# Patient Record
Sex: Female | Born: 1970 | Race: Black or African American | Hispanic: No | Marital: Married | Smoking: Never smoker
Health system: Southern US, Community
[De-identification: ages and names within clinical notes are randomized; demographics above are authoritative.]

## PROBLEM LIST (undated history)

## (undated) DIAGNOSIS — Z9889 Other specified postprocedural states: Secondary | ICD-10-CM

## (undated) DIAGNOSIS — Z789 Other specified health status: Secondary | ICD-10-CM

## (undated) DIAGNOSIS — R112 Nausea with vomiting, unspecified: Secondary | ICD-10-CM

---

## 1997-12-18 ENCOUNTER — Encounter: Admission: RE | Admit: 1997-12-18 | Discharge: 1997-12-18 | Payer: Self-pay | Admitting: Family Medicine

## 1998-01-15 ENCOUNTER — Encounter: Admission: RE | Admit: 1998-01-15 | Discharge: 1998-01-15 | Payer: Self-pay | Admitting: Sports Medicine

## 1998-02-11 ENCOUNTER — Ambulatory Visit (HOSPITAL_COMMUNITY): Admission: RE | Admit: 1998-02-11 | Discharge: 1998-02-11 | Payer: Self-pay | Admitting: *Deleted

## 1998-02-13 ENCOUNTER — Encounter: Admission: RE | Admit: 1998-02-13 | Discharge: 1998-02-13 | Payer: Self-pay | Admitting: Family Medicine

## 1998-03-14 ENCOUNTER — Encounter: Admission: RE | Admit: 1998-03-14 | Discharge: 1998-03-14 | Payer: Self-pay | Admitting: Family Medicine

## 1998-04-05 ENCOUNTER — Encounter: Admission: RE | Admit: 1998-04-05 | Discharge: 1998-04-05 | Payer: Self-pay | Admitting: Family Medicine

## 1998-04-26 ENCOUNTER — Encounter: Admission: RE | Admit: 1998-04-26 | Discharge: 1998-04-26 | Payer: Self-pay | Admitting: Family Medicine

## 1998-05-07 ENCOUNTER — Encounter: Admission: RE | Admit: 1998-05-07 | Discharge: 1998-05-07 | Payer: Self-pay | Admitting: Family Medicine

## 1998-05-14 ENCOUNTER — Encounter: Admission: RE | Admit: 1998-05-14 | Discharge: 1998-05-14 | Payer: Self-pay | Admitting: Sports Medicine

## 1998-05-28 ENCOUNTER — Encounter: Admission: RE | Admit: 1998-05-28 | Discharge: 1998-05-28 | Payer: Self-pay | Admitting: Family Medicine

## 1998-06-06 ENCOUNTER — Inpatient Hospital Stay (HOSPITAL_COMMUNITY): Admission: AD | Admit: 1998-06-06 | Discharge: 1998-06-09 | Payer: Self-pay | Admitting: *Deleted

## 1998-06-06 ENCOUNTER — Encounter: Admission: RE | Admit: 1998-06-06 | Discharge: 1998-06-06 | Payer: Self-pay | Admitting: Family Medicine

## 1998-06-12 ENCOUNTER — Encounter: Admission: RE | Admit: 1998-06-12 | Discharge: 1998-06-12 | Payer: Self-pay | Admitting: Family Medicine

## 2000-06-29 ENCOUNTER — Other Ambulatory Visit: Admission: RE | Admit: 2000-06-29 | Discharge: 2000-06-29 | Payer: Self-pay | Admitting: Obstetrics and Gynecology

## 2000-07-16 ENCOUNTER — Encounter: Payer: Self-pay | Admitting: Obstetrics and Gynecology

## 2000-07-16 ENCOUNTER — Inpatient Hospital Stay (HOSPITAL_COMMUNITY): Admission: AD | Admit: 2000-07-16 | Discharge: 2000-07-16 | Payer: Self-pay | Admitting: Obstetrics and Gynecology

## 2001-02-16 ENCOUNTER — Inpatient Hospital Stay (HOSPITAL_COMMUNITY): Admission: AD | Admit: 2001-02-16 | Discharge: 2001-02-19 | Payer: Self-pay | Admitting: Obstetrics & Gynecology

## 2001-02-16 ENCOUNTER — Encounter (INDEPENDENT_AMBULATORY_CARE_PROVIDER_SITE_OTHER): Payer: Self-pay | Admitting: Specialist

## 2003-03-26 ENCOUNTER — Other Ambulatory Visit: Admission: RE | Admit: 2003-03-26 | Discharge: 2003-03-26 | Payer: Self-pay | Admitting: *Deleted

## 2003-06-11 ENCOUNTER — Ambulatory Visit (HOSPITAL_COMMUNITY): Admission: RE | Admit: 2003-06-11 | Discharge: 2003-06-11 | Payer: Self-pay | Admitting: Obstetrics & Gynecology

## 2003-06-11 ENCOUNTER — Encounter: Payer: Self-pay | Admitting: Obstetrics & Gynecology

## 2003-06-18 ENCOUNTER — Inpatient Hospital Stay (HOSPITAL_COMMUNITY): Admission: AD | Admit: 2003-06-18 | Discharge: 2003-06-18 | Payer: Self-pay | Admitting: Obstetrics

## 2003-08-04 ENCOUNTER — Inpatient Hospital Stay (HOSPITAL_COMMUNITY): Admission: AD | Admit: 2003-08-04 | Discharge: 2003-08-04 | Payer: Self-pay

## 2003-10-22 ENCOUNTER — Encounter (INDEPENDENT_AMBULATORY_CARE_PROVIDER_SITE_OTHER): Payer: Self-pay | Admitting: Specialist

## 2003-10-22 ENCOUNTER — Inpatient Hospital Stay (HOSPITAL_COMMUNITY): Admission: RE | Admit: 2003-10-22 | Discharge: 2003-10-25 | Payer: Self-pay | Admitting: Obstetrics & Gynecology

## 2003-11-27 ENCOUNTER — Encounter: Admission: RE | Admit: 2003-11-27 | Discharge: 2003-12-27 | Payer: Self-pay | Admitting: Obstetrics & Gynecology

## 2005-01-08 ENCOUNTER — Emergency Department (HOSPITAL_COMMUNITY): Admission: EM | Admit: 2005-01-08 | Discharge: 2005-01-08 | Payer: Self-pay | Admitting: Emergency Medicine

## 2006-03-09 ENCOUNTER — Encounter: Admission: RE | Admit: 2006-03-09 | Discharge: 2006-03-09 | Payer: Self-pay | Admitting: General Practice

## 2006-03-15 ENCOUNTER — Encounter: Admission: RE | Admit: 2006-03-15 | Discharge: 2006-04-02 | Payer: Self-pay | Admitting: General Practice

## 2007-08-20 ENCOUNTER — Emergency Department (HOSPITAL_COMMUNITY): Admission: EM | Admit: 2007-08-20 | Discharge: 2007-08-20 | Payer: Self-pay | Admitting: Emergency Medicine

## 2009-08-26 ENCOUNTER — Encounter: Admission: RE | Admit: 2009-08-26 | Discharge: 2009-08-26 | Payer: Self-pay | Admitting: Family Medicine

## 2011-01-23 NOTE — Op Note (Signed)
Kerri Sims, Kerri Sims                           ACCOUNT NO.:  [id number]   MEDICAL RECORD NO.:  [id number]                   PATIENT TYPE:  INP   LOCATION:  9128                                 FACILITY:  [facility]   PHYSICIAN:  Roseanna Rainbow, M.D.         DATE OF BIRTH:  02-17-1971   DATE OF PROCEDURE:  10/22/2003  DATE OF DISCHARGE:                                 OPERATIVE REPORT   PREOPERATIVE DIAGNOSES:  1. Intrauterine pregnancy at term.  2. History of two previous cesarean deliveries.  3. Desires sterilization procedure.   POSTOPERATIVE DIAGNOSES:  1. Intrauterine pregnancy at term.  2. History of two previous cesarean deliveries.  3. Desires sterilization procedure.   PROCEDURE:  Repeat cesarean section and modified Pomeroy bilateral tubal  ligation.   SURGEON:  Roseanna Rainbow, M.D.   ASSISTANT:  Bing Neighbors. Clearance Coots, M.D.   ANESTHESIA:  Spinal.   ESTIMATED BLOOD LOSS:  600 mL.   COMPLICATIONS:  None.   FINDINGS:  Live born female, vertex, 9 pounds 8 ounces.  There were several  subserosal myomas.   DESCRIPTION OF PROCEDURE:  The patient is taken to the operating room where  spinal anesthetic was administered without difficulty.  She was then placed  in the dorsal supine position with a leftward tilt, prepped and draped in  the usual sterile fashion.  A Pfannenstiel skin incision was then made with  the scalpel and carried down to the underlying layer of fascia with the  Bovie.  Fascia was nicked in the midline and this incision was extended  bilaterally with curved Mayo scissors.  The superior aspect of the fascial  incision was then grasped with Kocher clamps, tented up and the underlying  rectus muscles dissected off.  The inferior aspect of the fascial incision  was manipulated in similar fashion.  The rectus muscles were separated in  the midline.  The parietal peritoneum was tented up and entered sharply.  The peritoneal incision was then extended  superiorly and inferiorly with  good visualization of the bladder.  The bladder blade was then placed.  The  vesicouterine peritoneum was then tented up and entered sharply with  Metzenbaum scissors and the bladder flap was created sharply.  The bladder  blade was then replaced.  The lower uterine segment was then incised in a  transverse fashion with the scalpel.  The uterine incision was then extended  bluntly.  The infant's head was then delivered atraumatically.  The  oropharynx was suctioned with a bulb suction.  The cord was clamped and cut.  The infant was handed off to the awaiting neonatologist.  The placenta was  then delivered.  The uterus evacuated of amniotic fluid.  Clots and debris  were then wiped with laparotomy sponge.  The uterus was exteriorized.  The  uterine incision was then repaired with 0 Monocryl in a running interlocking  fashion.  Adequate hemostasis was noted.  The  left fallopian tube was  grasped in the mid isthmic area.  Two ligatures of 0 plain were then placed  and the intervening of tube excised.  Excellent hemostasis was noted.  The  right fallopian tube was manipulated in a similar fashion. The uterus was  returned to the abdomen.  Pericolic gutters were then irrigated.  The  parietal peritoneum was reapproximated with 2-0 Vicryl.  The fascia was  reapproximated with 0 PDS.  The skin was reapproximated with staples.  At  the close of the procedure the instrument and pad counts were said to be  correct x2.  The patient was taken to the PACU awake and in stable  condition.                                               Roseanna Rainbow, M.D.    Judee Clara  D:  10/22/2003  T:  10/22/2003  Job:  [id number]

## 2011-01-23 NOTE — Op Note (Signed)
[facility]  Patient:    Kerri Sims, Kerri Sims                        MRN: [id number] Proc. Date: 02/16/01 Adm. Date:  [id number] Attending:  Lars Pinks                           Operative Report  PREOPERATIVE DIAGNOSES:       1. Failed vaginal birth after cesarean.                               2. Failure to progress.  POSTOPERATIVE DIAGNOSES:      1. Failed vaginal birth after cesarean.                               2. Failure to progress.  PROCEDURE:                    Repeat low transverse cesarean section.  SURGEON:                      Richard D. Arlyce Dice, M.D.  ANESTHESIA:                   Epidural.  ESTIMATED BLOOD LOSS:         500 cc.  FINDINGS:                     Female infant.  Birth weight 8 lb 4 oz.  Apgars 9 and 9.  Clear amniotic fluid.  Normal-appearing tubes and ovaries.  Small fibroid on the fundus of the uterus.  INDICATIONS:                  This is a 40 year old gravida 2, para 1 who requested a trial of labor after cesarean.  The patient made slow progress and the decision was made after the patient was falling off the labor curve to offer the patient a cesarean section.  The patient considered the option of continuing the labor and requested that a repeat cesarean section be performed.  DESCRIPTION OF PROCEDURE:     The patient was taken to the operating room and an epidural anesthesia that had been used during labor was injected for surgical anesthesia.  The abdomen was prepped and draped in a sterile fashion. A low transverse incision white female through the previous laparoscopy scar and carried down to the level of the fascia, which was then extended transversely. The rectus muscle was then divided in the midline and the peritoneal cavity was entered sharply and extended bluntly.  The lower segment was identified. A bladder flap was created and the bladder was displaced inferiorly.  The lower segment was entered  sharply and extended bluntly.  The infant was then delivered.  Cord bloods were obtained.  The placenta was delivered.  The uterus was bluntly curetted.  The lower segment was closed with a single layer of running interlocking #1 Vicryl suture.  Hemostasis was noted to be present. The bladder flap was not reopposed.  The peritoneum was closed with a running 0 Vicryl suture. The fascia was closed with running 0 Vicryl suture.  The skin was closed with staples.  The patient tolerated the procedure well and left the operating room  in good condition. DD:  02/16/01 TD:  02/17/01 Job: [id number]

## 2011-01-23 NOTE — Discharge Summary (Signed)
Kerri Sims, Kerri Sims                           ACCOUNT NO.:  [id number]   MEDICAL RECORD NO.:  [id number]                   PATIENT TYPE:  INP   LOCATION:  9128                                 FACILITY:  [facility]   PHYSICIAN:  Charles A. Clearance Coots, M.D.             DATE OF BIRTH:  1970-12-03   DATE OF ADMISSION:  10/22/2003  DATE OF DISCHARGE:  10/25/2003                                 DISCHARGE SUMMARY   ADMITTING DIAGNOSES:  1. Thirty-nine week gestation.  2. Previous cesarean section x2.  3. Desires repeat cesarean section.  4. Desires permanent sterilization.   DISCHARGE DIAGNOSES:  1. Thirty-nine week gestation.  2. Previous cesarean section x2.  3. Status post low transverse cesarean section delivery of a viable female on     October 22, 2003 at 0907.  Apgars were 9 at 1 minute and 9 at 5 minutes.     Weight was 4325 gm.  Length was 53 cm.  Mother and infant discharged home     in good condition.  4. Bilateral tubal ligation.   REASON FOR ADMISSION:  A 40 year old, para 2, female, estimated date of  confinement of October 26, 2003, intrauterine pregnancy at [redacted] weeks  gestation, who presented for elective repeat cesarean section delivery and  bilateral tubal ligation.  The patient has a history of 2 previous cesarean  sections.   PAST MEDICAL HISTORY:   SURGERY:  Two cesarean sections.   ILLNESSES:  None.   MEDICATIONS:  Prenatal vitamins.   ALLERGIES:  No known drug allergies.   SOCIAL HISTORY:  Married.  Negative tobacco, alcohol, or recreational drug  use.   PHYSICAL EXAMINATION:  GENERAL:  Well-nourished, well-developed female in no  acute distress.  HEENT:  Normal.  NECK:  Supple without adenopathy.  LUNGS:  Clear to auscultation bilaterally.  HEART:  Regular rate and rhythm.  ABDOMEN:  Gravid, soft, nontender.  PELVIC:  Omitted.   ADMITTING LABORATORY VALUES:  Hemoglobin 12.7, hematocrit 36.9, white blood  cell count 8000, platelets 305,000.   HOSPITAL  COURSE:  The patient underwent a repeat low transverse cesarean  section and bilateral tubal ligation on October 22, 2003.  There were no  intraoperative complications.  The postoperative course was uncomplicated,  and the patient was discharged home on postoperative day #3 in good  condition.   DISCHARGE LABORATORY VALUES:  Hemoglobin 11.5, hematocrit 33.8, white blood  cell count 10,000, platelets 294,000.   DISCHARGE DISPOSITION:  1. Continue prenatal vitamins, Tylox, and ibuprofen prescribed for pain.  2. Routine written obstetrical discharge instructions were given for     cesarean section.  3. The patient is to call an office for a follow up appointment in 6 weeks.  Charles A. Clearance Coots, M.D.    CAH/MEDQ  D:  10/25/2003  T:  10/26/2003  Job:  [id number]

## 2011-01-23 NOTE — Discharge Summary (Signed)
[facility]  Patient:    Kerri Sims, Kerri Sims                        MRN: [id number] Adm. Date:  [id number] Disc. Date: [id number] Attending:  Lars Pinks Dictator:   Leilani Able, P.A.                           Discharge Summary  FINAL DIAGNOSES:              Failed vaginal birth after cesarean section and failure to progress.  PROCEDURE:                    Repeat low transverse cesarean section. Surgeon:  Dr. Ilda Mori.  Complications:  None.  HISTORY:                      This 40 year old G2, P1 requested a trial of labor after history of cesarean section.  Patient made slow progress and the decision was made to proceed with a repeat cesarean section after patient falling off a labor curve.  Patient considered options and decided to repeat her cesarean section.  Patient was taken to the operating room by Dr. Ilda Mori where a repeat low transverse cesarean section was performed with the delivery of an 8 pound 4 ounce female infant with Apgars of 9 and 9.  Delivery went without complications.  There as a small fibroid noted on the fundus of the uterus.  Patients postoperative course was benign without significant fevers.  Patient was felt ready for discharge on postoperative day #3.  She was sent home on a regular diet.  Told to decrease activities.  Told to continue prenatal vitamins and FeSo4.  Was given Motrin 600 mg one q.6h. p.r.n. pain.  Told to follow-up in the office in four weeks. DD:  03/09/01 TD:  03/09/01 Job: [id number]

## 2011-09-04 ENCOUNTER — Other Ambulatory Visit: Payer: Self-pay | Admitting: Obstetrics & Gynecology

## 2011-09-04 DIAGNOSIS — N9489 Other specified conditions associated with female genital organs and menstrual cycle: Secondary | ICD-10-CM

## 2011-09-11 ENCOUNTER — Ambulatory Visit (HOSPITAL_COMMUNITY)
Admission: RE | Admit: 2011-09-11 | Discharge: 2011-09-11 | Disposition: A | Discharge status: Home / Self Care | Payer: Self-pay | Source: Ambulatory Visit | Attending: Obstetrics & Gynecology | Admitting: Obstetrics & Gynecology

## 2011-09-11 DIAGNOSIS — N9489 Other specified conditions associated with female genital organs and menstrual cycle: Secondary | ICD-10-CM | POA: Insufficient documentation

## 2011-09-11 DIAGNOSIS — D252 Subserosal leiomyoma of uterus: Secondary | ICD-10-CM | POA: Insufficient documentation

## 2012-08-28 ENCOUNTER — Ambulatory Visit: Payer: Self-pay | Admitting: Physician Assistant

## 2012-08-28 VITALS — BP 107/74 | HR 110 | Temp 100.7°F | Resp 17 | Ht 63.0 in | Wt 184.0 lb

## 2012-08-28 DIAGNOSIS — R509 Fever, unspecified: Secondary | ICD-10-CM

## 2012-08-28 DIAGNOSIS — J029 Acute pharyngitis, unspecified: Secondary | ICD-10-CM

## 2012-08-28 DIAGNOSIS — J101 Influenza due to other identified influenza virus with other respiratory manifestations: Secondary | ICD-10-CM

## 2012-08-28 DIAGNOSIS — J111 Influenza due to unidentified influenza virus with other respiratory manifestations: Secondary | ICD-10-CM

## 2012-08-28 LAB — POCT INFLUENZA A/B: Influenza B, POC: NEGATIVE

## 2012-08-28 MED ORDER — OSELTAMIVIR PHOSPHATE 75 MG PO CAPS: 75.0000 mg | ORAL_CAPSULE | Freq: Two times a day (BID) | ORAL | Status: DC

## 2012-08-28 MED ORDER — ACETAMINOPHEN 325 MG PO TABS
1000.0000 mg | ORAL_TABLET | Freq: Once | ORAL | Status: AC
  Administered 2012-08-28: 975 mg via ORAL

## 2012-08-28 NOTE — Progress Notes (Signed)
  Subjective:    Patient ID: Kerri Sims, female    DOB: April 08, 1971, 41 y.o.   MRN: [id number]  HPI  Acute onset of fever, bodyaches, cough, runny nose, sneezing, and sore throat. She started feeling bad <48 hrs ago. No N/V/D.  She hasn't taken any OTCs.  Review of Systems  All other systems reviewed and are negative.       Objective:   Physical Exam  Nursing note and vitals reviewed. Constitutional: She is oriented to person, place, and time. She appears well-developed and well-nourished.       Appears ill, but not toxic  HENT:  Head: Normocephalic and atraumatic.  Right Ear: External ear normal.  Left Ear: External ear normal.  Mouth/Throat: No oropharyngeal exudate (throat with erythema and PND, no exudate).       Nose with clear rhinorrhea  Neck: Normal range of motion. Neck supple.  Cardiovascular: Normal rate, regular rhythm and normal heart sounds.        Mild tachycardia secondary to fever.  Pulmonary/Chest: Effort normal and breath sounds normal. She has no wheezes. She has no rales.  Lymphadenopathy:    She has no cervical adenopathy.  Neurological: She is alert and oriented to person, place, and time.  Skin: Skin is warm and dry.  Psychiatric: She has a normal mood and affect. Her behavior is normal.    Results for orders placed in visit on 08/28/12  POCT INFLUENZA A/B      Component Value Range   Influenza A, POC Positive     Influenza B, POC Negative           Assessment & Plan:  Influenza A-tamiflu, fluids, rest, respiratory care, OTC meds as needed to help with symptoms.

## 2012-12-02 ENCOUNTER — Encounter (HOSPITAL_COMMUNITY): Payer: Self-pay | Admitting: Emergency Medicine

## 2012-12-02 ENCOUNTER — Emergency Department (HOSPITAL_COMMUNITY): Payer: Self-pay

## 2012-12-02 ENCOUNTER — Emergency Department (HOSPITAL_COMMUNITY)
Admission: EM | Admit: 2012-12-02 | Discharge: 2012-12-02 | Disposition: A | Discharge status: Home / Self Care | Payer: Self-pay | Attending: Emergency Medicine | Admitting: Emergency Medicine

## 2012-12-02 DIAGNOSIS — E669 Obesity, unspecified: Secondary | ICD-10-CM | POA: Insufficient documentation

## 2012-12-02 DIAGNOSIS — Z862 Personal history of diseases of the blood and blood-forming organs and certain disorders involving the immune mechanism: Secondary | ICD-10-CM | POA: Insufficient documentation

## 2012-12-02 DIAGNOSIS — Z3202 Encounter for pregnancy test, result negative: Secondary | ICD-10-CM | POA: Insufficient documentation

## 2012-12-02 DIAGNOSIS — R1031 Right lower quadrant pain: Secondary | ICD-10-CM | POA: Insufficient documentation

## 2012-12-02 DIAGNOSIS — R109 Unspecified abdominal pain: Secondary | ICD-10-CM

## 2012-12-02 LAB — COMPREHENSIVE METABOLIC PANEL
ALT: 9 U/L (ref 0–35)
AST: 14 U/L (ref 0–37)
Albumin: 3.7 g/dL (ref 3.5–5.2)
Alkaline Phosphatase: 47 U/L (ref 39–117)
CO2: 23 mEq/L (ref 19–32)
Chloride: 101 mEq/L (ref 96–112)
Creatinine, Ser: 0.67 mg/dL (ref 0.50–1.10)
GFR calc non Af Amer: >90 mL/min (ref >90)
Potassium: 3.5 mEq/L (ref 3.5–5.1)
Sodium: 135 mEq/L (ref 135–145)
Total Bilirubin: 0.2 mg/dL — ABNORMAL LOW (ref 0.3–1.2)

## 2012-12-02 LAB — URINALYSIS, ROUTINE W REFLEX MICROSCOPIC
Glucose, UA: NEGATIVE mg/dL
Ketones, ur: NEGATIVE mg/dL
Nitrite: POSITIVE — AB
Protein, ur: NEGATIVE mg/dL

## 2012-12-02 LAB — CBC WITH DIFFERENTIAL/PLATELET
Basophils Absolute: 0.1 10*3/uL (ref 0.0–0.1)
Basophils Relative: 1 % (ref 0–1)
HCT: 27.6 % — ABNORMAL LOW (ref 36.0–46.0)
Lymphocytes Relative: 39 % (ref 12–46)
MCHC: 31.9 g/dL (ref 30.0–36.0)
Neutro Abs: 3.4 10*3/uL (ref 1.7–7.7)
Neutrophils Relative %: 51 % (ref 43–77)
Platelets: 399 10*3/uL (ref 150–400)
RDW: 15.6 % — ABNORMAL HIGH (ref 11.5–15.5)
WBC: 6.7 10*3/uL (ref 4.0–10.5)

## 2012-12-02 LAB — WET PREP, GENITAL
Trich, Wet Prep: NONE SEEN
Yeast Wet Prep HPF POC: NONE SEEN

## 2012-12-02 LAB — URINE MICROSCOPIC-ADD ON

## 2012-12-02 MED ORDER — IOHEXOL 300 MG/ML  SOLN
50.0000 mL | Freq: Once | INTRAMUSCULAR | Status: AC | PRN
  Administered 2012-12-02: 50 mL via ORAL

## 2012-12-02 MED ORDER — IOHEXOL 300 MG/ML  SOLN
100.0000 mL | Freq: Once | INTRAMUSCULAR | Status: AC | PRN
  Administered 2012-12-02: 100 mL via INTRAVENOUS

## 2012-12-02 MED ORDER — HYDROCODONE-ACETAMINOPHEN 5-325 MG PO TABS: 1.0000 | ORAL_TABLET | ORAL | Status: DC | PRN

## 2012-12-02 MED ORDER — MORPHINE SULFATE 4 MG/ML IJ SOLN
4.0000 mg | Freq: Once | INTRAMUSCULAR | Status: AC
  Administered 2012-12-02: 4 mg via INTRAVENOUS
  Filled 2012-12-02: qty 1

## 2012-12-02 NOTE — ED Notes (Signed)
Ct notified that pt finished drinking with first cup the contrast.

## 2012-12-02 NOTE — ED Notes (Signed)
Pt c/o pain in lower abd st's mostly in RLQ radiating into back.  Pt denies any nausea vomiting or diarrhea.  Onset yesterday.  Denies any urinary symptoms

## 2012-12-02 NOTE — ED Provider Notes (Signed)
History     CSN: [id number]  Arrival date & time 12/02/12  1543   None     Chief Complaint  Patient presents with  . Abdominal Pain    (Consider location/radiation/quality/duration/timing/severity/associated sxs/prior treatment) HPI History provided by pt.   Pt has had constant, severe RLQ pain since yesterday.  Aggravated by urination.  No associated fever, N/V/D, hematochezia/melena, vaginal sx, or other urinary sx, w/ exception that it is green in color.  H/o iron-deficiency anemia, likely secondary to menorrhagia (LMP 2 weeks ago) and uterine fibroids, otherwise healthy.  Past abd surgeries include 3 c-sections.     History reviewed. No pertinent past medical history.  Past Surgical History  Procedure Laterality Date  . Cesarean section      No family history on file.  History  Substance Use Topics  . Smoking status: Never Smoker   . Smokeless tobacco: Not on file  . Alcohol Use: No    OB History   Grav Para Term Preterm Abortions TAB SAB Ect Mult Living                  Review of Systems  Constitutional: Negative for fatigue.       No generalized weakness  Respiratory: Negative for shortness of breath.   Neurological: Negative for syncope and light-headedness.  All other systems reviewed and are negative.    Allergies  Review of patient's allergies indicates no known allergies.  Home Medications   Current Outpatient Rx  Name  Route  Sig  Dispense  Refill  . ibuprofen (ADVIL,MOTRIN) 200 MG tablet   Oral   Take 400 mg by mouth daily as needed for pain. For pain         . metroNIDAZOLE (FLAGYL) 250 MG tablet   Oral   Take 500 mg by mouth daily.           BP 116/69  Pulse 87  Temp(Src) 98.8 F (37.1 C) (Oral)  Resp 16  Ht 5\' 2"  (1.575 m)  Wt 176 lb (79.833 kg)  BMI 32.18 kg/m2  SpO2 100%  LMP 11/18/2012  Physical Exam  Nursing note and vitals reviewed. Constitutional: She is oriented to person, place, and time. She appears  well-developed and well-nourished. No distress.  HENT:  Head: Normocephalic and atraumatic.  Eyes:  Normal appearance  Neck: Normal range of motion.  Cardiovascular: Normal rate and regular rhythm.   Pulmonary/Chest: Effort normal and breath sounds normal. No respiratory distress.  Abdominal: Soft. Bowel sounds are normal. She exhibits no distension and no mass. There is no rebound.  Obese.  Diffuse tenderness across lower abdomen, worst in RLQ w/ guarding.    Genitourinary:  No CVA tenderness. Nml external genitalia.  No vaginal discharge/bleeding.  Cervix closed and appears nml.  No adnexal or cervical motion tenderess.    Musculoskeletal: Normal range of motion.  Neurological: She is alert and oriented to person, place, and time.  Skin: Skin is warm and dry. No rash noted.  Psychiatric: She has a normal mood and affect. Her behavior is normal.    ED Course  Procedures (including critical care time)  Labs Reviewed  URINALYSIS, ROUTINE W REFLEX MICROSCOPIC - Abnormal; Notable for the following:    Nitrite POSITIVE (*)    Leukocytes, UA MODERATE (*)    All other components within normal limits  CBC WITH DIFFERENTIAL - Abnormal; Notable for the following:    RBC 3.57 (*)    Hemoglobin 8.8 (*)  HCT 27.6 (*)    MCV 77.3 (*)    MCH 24.6 (*)    RDW 15.6 (*)    All other components within normal limits  COMPREHENSIVE METABOLIC PANEL - Abnormal; Notable for the following:    Glucose, Bld 115 (*)    Total Bilirubin 0.2 (*)    All other components within normal limits  URINE MICROSCOPIC-ADD ON - Abnormal; Notable for the following:    Squamous Epithelial / LPF FEW (*)    All other components within normal limits  WET PREP, GENITAL  GC/CHLAMYDIA PROBE AMP  POCT PREGNANCY, URINE   Ct Abdomen Pelvis W Contrast  12/02/2012  *RADIOLOGY REPORT*  Clinical Data: Right lower quadrant pain for 2 days question appendicitis  CT ABDOMEN AND PELVIS WITH CONTRAST  Technique:  Multidetector CT  imaging of the abdomen and pelvis was performed following the standard protocol during bolus administration of intravenous contrast. Sagittal and coronal MPR images reconstructed from axial data set.  Contrast: OMNIPAQUE IOHEXOL 300 MG/ML  SOLN Dilute oral contrast.  Comparison: Pelvic ultrasound 09/11/2011  Findings: Lung bases grossly clear. Fullness of left renal calyces without gross pelviectasis or ureteral dilatation. Liver, spleen, pancreas, kidneys, and adrenal glands otherwise normal appearance. Normal-appearing retrocecal appendix. Unremarkable bladder.  Nonspecific low attenuation free pelvic fluid. Enlarged uterus containing a large leiomyoma centrally 8.3 x 7.6 x 8.1 cm. Additionally, large soft tissue masses are identified lateral to the uterus bilaterally, 7.0 x 6.1 x 7.3 cm on the right and 5.8 x 5.8 x 6.0 cm on left, by prior ultrasound showing to represent exophytic leiomyomata. Right ovary mildly enlarged for age 47.2 x 3.4 by 4.8 cm, containing a collapsed question ruptured ovarian cyst. Left ovary unremarkable.  Small umbilical hernia containing fat. Stomach and bowel loops grossly unremarkable for incomplete bowel opacification. No additional mass, adenopathy, or free air. No acute osseous findings.  IMPRESSION: Enlarged uterus containing at least three large leiomyomata as above. Free fluid in pelvis question related to a ruptured right ovarian cyst. No evidence of appendicitis. Small umbilical hernia containing fat.   Original Report Authenticated By: Ulyses Southward, M.D.      1. Abdominal pain       MDM  42yo F w/ h/o iron-deficiency anemia, otherwise healthy, presents w/ RLQ pain.  On exam, afebrile, NAD, abd soft, non-distended, tender in RLQ w/ guarding but no rebound, nml genitalia.  Labs sig for hgb 8.8.  No prior for comparison.  Pt denies hematochezia/melena as well as recent lightheadedness, SOB, worse than baseline fatigue and generalized weakness.  CT abd/pelvis ordered  and pending.  Pt to receive morphine for pain.  9:03 PM   No acute CT findings.  Results discussed w/ pt.  Suspect that pain is related to uterine fibroids.  Recommended f/u with Gyn, both for fibroids and anemia.  D/c'd home w/ vicodin for pain.  Return precautions, including worsening abd pain, syncope and SOB discussed. 10:24 PM       Otilio Miu, PA-C 12/02/12 2224  Otilio Miu, PA-C 12/02/12 2233

## 2012-12-03 LAB — URINE CULTURE

## 2012-12-03 NOTE — ED Provider Notes (Signed)
Medical screening examination/treatment/procedure(s) were performed by non-physician practitioner and as supervising physician I was immediately available for consultation/collaboration.   Jessie Cowher, MD 12/03/12 0016 

## 2013-02-08 ENCOUNTER — Encounter: Payer: Self-pay | Admitting: Obstetrics & Gynecology

## 2013-02-08 ENCOUNTER — Ambulatory Visit (INDEPENDENT_AMBULATORY_CARE_PROVIDER_SITE_OTHER): Payer: BC Managed Care – PPO | Admitting: Obstetrics & Gynecology

## 2013-02-08 VITALS — BP 114/75 | HR 98 | Temp 98.3°F | Ht 62.0 in | Wt 182.2 lb

## 2013-02-08 DIAGNOSIS — D259 Leiomyoma of uterus, unspecified: Secondary | ICD-10-CM

## 2013-02-08 DIAGNOSIS — N949 Unspecified condition associated with female genital organs and menstrual cycle: Secondary | ICD-10-CM

## 2013-02-08 DIAGNOSIS — Z01419 Encounter for gynecological examination (general) (routine) without abnormal findings: Secondary | ICD-10-CM

## 2013-02-08 DIAGNOSIS — Z3202 Encounter for pregnancy test, result negative: Secondary | ICD-10-CM

## 2013-02-08 LAB — POCT URINE PREGNANCY: Preg Test, Ur: NEGATIVE

## 2013-02-08 LAB — HEMOGLOBIN AND HEMATOCRIT, BLOOD: HCT: 30.9 % — ABNORMAL LOW (ref 36.0–46.0)

## 2013-02-08 MED ORDER — FUSION PLUS PO CAPS: 1.0000 | ORAL_CAPSULE | Freq: Every morning | ORAL | Status: DC

## 2013-02-08 MED ORDER — NORETHINDRONE ACETATE 5 MG PO TABS: 10.0000 mg | ORAL_TABLET | Freq: Every day | ORAL | Status: DC

## 2013-02-08 NOTE — Patient Instructions (Addendum)
Hysterectomy Information  A hysterectomy is a procedure where your uterus is surgically removed. It will no longer be possible to have menstrual periods or to become pregnant. The tubes and ovaries can be removed (bilateral salpingo-oopherectomy) during this surgery as well.  REASONS FOR A HYSTERECTOMY  Persistent, abnormal bleeding.  Lasting (chronic) pelvic pain or infection.  The lining of the uterus (endometrium) starts growing outside the uterus (endometriosis).  The endometrium starts growing in the muscle of the uterus (adenomyosis).  The uterus falls down into the vagina (pelvic organ prolapse).  Symptomatic uterine fibroids.  Precancerous cells.  Cervical cancer or uterine cancer. TYPES OF HYSTERECTOMIES  Supracervical hysterectomy. This type removes the top part of the uterus, but not the cervix.  Total hysterectomy. This type removes the uterus and cervix.  Radical hysterectomy. This type removes the uterus, cervix, and the fibrous tissue that holds the uterus in place in the pelvis (parametrium). WAYS A HYSTERECTOMY CAN BE PERFORMED  Abdominal hysterectomy. A large surgical cut (incision) is made in the abdomen. The uterus is removed through this incision.  Vaginal hysterectomy. An incision is made in the vagina. The uterus is removed through this incision. There are no abdominal incisions.  Conventional laparoscopic hysterectomy. A thin, lighted tube with a camera (laparoscope) is inserted into 3 or 4 small incisions in the abdomen. The uterus is cut into small pieces. The small pieces are removed through the incisions, or they are removed through the vagina.  Laparoscopic assisted vaginal hysterectomy (LAVH). Three or four small incisions are made in the abdomen. Part of the surgery is performed laparoscopically and part vaginally. The uterus is removed through the vagina.  Robot-assisted laparoscopic hysterectomy. A laparoscope is inserted into 3 or 4 small  incisions in the abdomen. A computer-controlled device is used to give the surgeon a 3D image. This allows for more precise movements of surgical instruments. The uterus is cut into small pieces and removed through the incisions or removed through the vagina. RISKS OF HYSTERECTOMY   Bleeding and risk of blood transfusion. Tell your caregiver if you do not want to receive any blood products.  Blood clots in the legs or lung.  Infection.  Injury to surrounding organs.  Anesthesia problems or side effects.  Conversion to an abdominal hysterectomy. WHAT TO EXPECT AFTER A HYSTERECTOMY  You will be given pain medicine.  You will need to have someone with you for the first 3 to 5 days after you go home.  You will need to follow up with your surgeon in 2 to 4 weeks after surgery to evaluate your progress.  You may have early menopause symptoms like hot flashes, night sweats, and insomnia.  If you had a hysterectomy for a problem that was not a cancer or a condition that could lead to cancer, then you no longer need Pap tests. However, even if you no longer need a Pap test, a regular exam is a good idea to make sure no other problems are starting. Document Released: 02/17/2001 Document Revised: 11/16/2011 Document Reviewed: 04/04/2011 [employer] Patient Information 2014 [employer].  

## 2013-02-08 NOTE — Progress Notes (Signed)
.   Subjective:     Kerri Sims is a 42 y.o. female here for a problem exam.  Current complaints: abdominal pain for a couple of month.  The pain occurs after each monthly cycle.  She has an abnormal discharge - denies any itching, burning or odor. Personal health questionnaire reviewed: no.   Gynecologic History Patient's last menstrual period was 01/25/2013. Contraception: tubal ligation Last Pap: 11/2010 . Results were: normal Last mammogram: 2011. Results were: normal  Obstetric History OB History   Grav Para Term Preterm Abortions TAB SAB Ect Mult Living                   The following portions of the patient's history were reviewed and updated as appropriate: allergies, current medications, past family history, past medical history, past social history, past surgical history and problem list.  Review of Systems Pertinent items are noted in HPI.    Objective:    Abdomen: abnormal findings:  mass, located in the lower abdomen and arising from the pelvis, irregular Pelvic: cervix normal in appearance, external genitalia normal, vagina normal without discharge and Uterus approximately 16 weeks size, irregular contour    Assessment:   Symptomatic uterine fibroids  Plan:  A TAH w/salpingectomies is planned  Education reviewed: hystectomy.

## 2013-02-09 ENCOUNTER — Other Ambulatory Visit: Payer: Self-pay | Admitting: Family Medicine

## 2013-02-09 DIAGNOSIS — Z1231 Encounter for screening mammogram for malignant neoplasm of breast: Secondary | ICD-10-CM

## 2013-02-09 LAB — PAP IG, CT-NG NAA, HPV HIGH-RISK
Chlamydia Probe Amp: NEGATIVE
GC Probe Amp: NEGATIVE

## 2013-02-10 DIAGNOSIS — D259 Leiomyoma of uterus, unspecified: Secondary | ICD-10-CM | POA: Insufficient documentation

## 2013-02-10 DIAGNOSIS — N949 Unspecified condition associated with female genital organs and menstrual cycle: Secondary | ICD-10-CM | POA: Insufficient documentation

## 2013-02-13 ENCOUNTER — Encounter: Payer: Self-pay | Admitting: Obstetrics & Gynecology

## 2013-02-16 ENCOUNTER — Encounter: Payer: Self-pay | Admitting: Obstetrics & Gynecology

## 2013-02-17 ENCOUNTER — Other Ambulatory Visit: Payer: Self-pay | Admitting: *Deleted

## 2013-02-17 MED ORDER — METRONIDAZOLE IVPB CUSTOM: 500.0000 mg | Freq: Once | INTRAVENOUS | Status: DC

## 2013-02-22 ENCOUNTER — Encounter: Payer: Self-pay | Admitting: Obstetrics & Gynecology

## 2013-03-17 ENCOUNTER — Ambulatory Visit
Admission: RE | Admit: 2013-03-17 | Discharge: 2013-03-17 | Disposition: A | Discharge status: Home / Self Care | Payer: BC Managed Care – PPO | Source: Ambulatory Visit | Attending: Family Medicine | Admitting: Family Medicine

## 2013-03-17 DIAGNOSIS — Z1231 Encounter for screening mammogram for malignant neoplasm of breast: Secondary | ICD-10-CM

## 2013-03-20 ENCOUNTER — Other Ambulatory Visit: Payer: Self-pay | Admitting: Family Medicine

## 2013-03-20 DIAGNOSIS — R928 Other abnormal and inconclusive findings on diagnostic imaging of breast: Secondary | ICD-10-CM

## 2013-03-21 ENCOUNTER — Encounter: Payer: Self-pay | Admitting: Obstetrics & Gynecology

## 2013-03-21 ENCOUNTER — Encounter (HOSPITAL_COMMUNITY): Payer: Self-pay | Admitting: *Deleted

## 2013-03-24 ENCOUNTER — Encounter (HOSPITAL_COMMUNITY): Payer: Self-pay | Admitting: Pharmacist

## 2013-03-27 ENCOUNTER — Ambulatory Visit
Admission: RE | Admit: 2013-03-27 | Discharge: 2013-03-27 | Disposition: A | Discharge status: Home / Self Care | Payer: BC Managed Care – PPO | Source: Ambulatory Visit | Attending: Family Medicine | Admitting: Family Medicine

## 2013-03-27 DIAGNOSIS — R928 Other abnormal and inconclusive findings on diagnostic imaging of breast: Secondary | ICD-10-CM

## 2013-03-30 NOTE — H&P (Signed)
  Subjective:  Kerri Sims is a 42 y.o.  female.  She carries a diagnosis of uterine fibroids.  Her symptoms are primarily related to pain/pressure.  An U/S in 1/13 showed an enlarged uterus with diffuse fibroid involvement  Pertinent Gyn History:  Menses regular  Last pap: normal Date: 2012  Patient Active Problem List   Diagnosis Date Noted  . Leiomyoma of uterus, unspecified 02/10/2013  . Unspecified symptom associated with female genital organs 02/10/2013   Past Medical History  Diagnosis Date  . PONV (postoperative nausea and vomiting)   . Medical history non-contributory     Past Surgical History  Procedure Laterality Date  . Cesarean section      No prescriptions prior to admission   Allergies  Allergen Reactions  . Percocet (Oxycodone-Acetaminophen) Itching    History  Substance Use Topics  . Smoking status: Never Smoker   . Smokeless tobacco: Not on file  . Alcohol Use: No    Family History  Problem Relation Age of Onset  . Hyperlipidemia Mother   . Hypertension Mother   . Stroke Father      Review of Systems Pertinent items are noted in HPI.    Objective:   General Appearance:    Alert, cooperative, no distress, appears stated age  Head:    Normocephalic, without obvious abnormality, atraumatic  Eyes:    PERRL, conjunctiva/corneas clear, EOM's intact, fundi    benign, both eyes  Ears:    Normal TM's and external ear canals, both ears  Nose:   Nares normal, septum midline, mucosa normal, no drainage    or sinus tenderness  Throat:   Lips, mucosa, and tongue normal; teeth and gums normal  Neck:   Supple, symmetrical, trachea midline, no adenopathy;    thyroid:  no enlargement/tenderness/nodules; no carotid   bruit or JVD  Back:     Symmetric, no curvature, ROM normal, no CVA tenderness  Lungs:     Clear to auscultation bilaterally, respirations unlabored  Chest Wall:    No tenderness or deformity   Heart:    Regular rate and rhythm, S1 and S2  normal, no murmur, rub   or gallop  Breast Exam:    No tenderness, masses, or nipple abnormality  Abdomen:     Soft, non-tender, bowel sounds active all four quadrants,    Mobile, midline mass arising from the pelvis to 4 finger breaths below the umbilicus  Genitalia:    Normal female without lesion, discharge or tenderness; uterus enlarged, irregular in contour, approximately 16 weeks size  Extremities:   Extremities normal, atraumatic, no cyanosis or edema  Pulses:   2+ and symmetric all extremities  Skin:   Skin color, texture, turgor normal, no rashes or lesions  Lymph nodes:   Cervical, supraclavicular, and axillary nodes normal  Neurologic:   CNII-XII intact, normal strength, sensation and reflexes    throughout         Assessment/Plan:  Large fibroid uterus with secondary pain/pressure   I had a lengthy discussion with the patient regarding her symptoms and consideration for a hysterectomy.  Procedure, risks, reasons, benefits and complications (including injury to bowel, bladder, major blood vessel, ureter, bleeding, possibility of transfusion, infection, or fistula formation) were reviewed in detail. Consent was signed and preop testing was ordered.  Instructions were reviewed, including NPO after midnight.

## 2013-03-31 ENCOUNTER — Encounter (HOSPITAL_COMMUNITY)
Admission: RE | Disposition: A | Discharge status: Home / Self Care | Payer: Self-pay | Source: Ambulatory Visit | Attending: Obstetrics & Gynecology

## 2013-03-31 ENCOUNTER — Encounter (HOSPITAL_COMMUNITY): Payer: Self-pay | Admitting: Anesthesiology

## 2013-03-31 ENCOUNTER — Inpatient Hospital Stay (HOSPITAL_COMMUNITY)
Admission: RE | Admit: 2013-03-31 | Discharge: 2013-04-03 | DRG: 359 | Disposition: A | Discharge status: Home / Self Care | Payer: BC Managed Care – PPO | Source: Ambulatory Visit | Attending: Obstetrics & Gynecology | Admitting: Obstetrics & Gynecology

## 2013-03-31 ENCOUNTER — Inpatient Hospital Stay (HOSPITAL_COMMUNITY): Payer: BC Managed Care – PPO | Admitting: Anesthesiology

## 2013-03-31 DIAGNOSIS — D259 Leiomyoma of uterus, unspecified: Secondary | ICD-10-CM

## 2013-03-31 DIAGNOSIS — D252 Subserosal leiomyoma of uterus: Principal | ICD-10-CM | POA: Diagnosis present

## 2013-03-31 DIAGNOSIS — D251 Intramural leiomyoma of uterus: Secondary | ICD-10-CM | POA: Diagnosis present

## 2013-03-31 HISTORY — DX: Other specified health status: Z78.9

## 2013-03-31 HISTORY — PX: BILATERAL SALPINGECTOMY: SHX5743

## 2013-03-31 HISTORY — DX: Other specified postprocedural states: Z98.890

## 2013-03-31 HISTORY — PX: ABDOMINAL HYSTERECTOMY: SHX81

## 2013-03-31 HISTORY — DX: Other specified postprocedural states: R11.2

## 2013-03-31 LAB — ABO/RH: ABO/RH(D): O POS

## 2013-03-31 LAB — TYPE AND SCREEN: Antibody Screen: NEGATIVE

## 2013-03-31 LAB — CBC
HCT: 39.5 % (ref 36.0–46.0)
Hemoglobin: 13.3 g/dL (ref 12.0–15.0)
MCHC: 33.7 g/dL (ref 30.0–36.0)
RBC: 4.46 MIL/uL (ref 3.87–5.11)
WBC: 5.4 10*3/uL (ref 4.0–10.5)

## 2013-03-31 SURGERY — HYSTERECTOMY, ABDOMINAL
Anesthesia: General | Site: Abdomen | Wound class: Clean Contaminated

## 2013-03-31 MED ORDER — LIDOCAINE HCL (CARDIAC) 20 MG/ML IV SOLN
INTRAVENOUS | Status: AC
  Filled 2013-03-31: qty 5

## 2013-03-31 MED ORDER — DEXAMETHASONE SODIUM PHOSPHATE 10 MG/ML IJ SOLN
INTRAMUSCULAR | Status: DC | PRN
  Administered 2013-03-31: 10 mg via INTRAVENOUS

## 2013-03-31 MED ORDER — PANTOPRAZOLE SODIUM 40 MG PO TBEC
40.0000 mg | DELAYED_RELEASE_TABLET | Freq: Every day | ORAL | Status: DC
  Administered 2013-03-31 – 2013-04-03 (×4): 40 mg via ORAL
  Filled 2013-03-31 (×4): qty 1

## 2013-03-31 MED ORDER — IBUPROFEN 800 MG PO TABS: 800.0000 mg | ORAL_TABLET | Freq: Four times a day (QID) | ORAL | Status: DC

## 2013-03-31 MED ORDER — NEOSTIGMINE METHYLSULFATE 1 MG/ML IJ SOLN
INTRAMUSCULAR | Status: DC | PRN
  Administered 2013-03-31: 3 mg via INTRAVENOUS

## 2013-03-31 MED ORDER — ZOLPIDEM TARTRATE 5 MG PO TABS: 5.0000 mg | ORAL_TABLET | Freq: Every evening | ORAL | Status: DC | PRN

## 2013-03-31 MED ORDER — ONDANSETRON HCL 4 MG/2ML IJ SOLN: 4.0000 mg | Freq: Four times a day (QID) | INTRAMUSCULAR | Status: DC | PRN

## 2013-03-31 MED ORDER — HYDROMORPHONE HCL PF 1 MG/ML IJ SOLN
INTRAMUSCULAR | Status: AC
  Administered 2013-03-31: 0.5 mg via INTRAVENOUS
  Filled 2013-03-31: qty 1

## 2013-03-31 MED ORDER — MIDAZOLAM HCL 5 MG/5ML IJ SOLN
INTRAMUSCULAR | Status: DC | PRN
  Administered 2013-03-31: 2 mg via INTRAVENOUS

## 2013-03-31 MED ORDER — PROPOFOL 10 MG/ML IV EMUL
INTRAVENOUS | Status: AC
Start: 2013-03-31 — End: 2013-03-31
  Filled 2013-03-31: qty 20

## 2013-03-31 MED ORDER — ENSURE COMPLETE PO LIQD
237.0000 mL | Freq: Two times a day (BID) | ORAL | Status: DC
  Filled 2013-03-31: qty 237

## 2013-03-31 MED ORDER — KETOROLAC TROMETHAMINE 30 MG/ML IJ SOLN: 30.0000 mg | Freq: Four times a day (QID) | INTRAMUSCULAR | Status: DC

## 2013-03-31 MED ORDER — METOCLOPRAMIDE HCL 5 MG/ML IJ SOLN: 10.0000 mg | Freq: Once | INTRAMUSCULAR | Status: DC | PRN

## 2013-03-31 MED ORDER — CEFAZOLIN SODIUM-DEXTROSE 2-3 GM-% IV SOLR
INTRAVENOUS | Status: AC
  Filled 2013-03-31: qty 50

## 2013-03-31 MED ORDER — ENOXAPARIN SODIUM 40 MG/0.4ML ~~LOC~~ SOLN
40.0000 mg | SUBCUTANEOUS | Status: DC
  Filled 2013-03-31: qty 0.4

## 2013-03-31 MED ORDER — SODIUM CHLORIDE 0.9 % IJ SOLN
INTRAMUSCULAR | Status: DC | PRN
  Administered 2013-03-31: 20 mL

## 2013-03-31 MED ORDER — ROCURONIUM BROMIDE 100 MG/10ML IV SOLN
INTRAVENOUS | Status: DC | PRN
  Administered 2013-03-31 (×3): 10 mg via INTRAVENOUS
  Administered 2013-03-31: 50 mg via INTRAVENOUS

## 2013-03-31 MED ORDER — GLYCOPYRROLATE 0.2 MG/ML IJ SOLN
INTRAMUSCULAR | Status: DC | PRN
  Administered 2013-03-31: 0.6 mg via INTRAVENOUS

## 2013-03-31 MED ORDER — ENSURE COMPLETE PO LIQD
237.0000 mL | Freq: Two times a day (BID) | ORAL | Status: DC
  Administered 2013-03-31 – 2013-04-03 (×6): 237 mL via ORAL
  Filled 2013-03-31 (×6): qty 237

## 2013-03-31 MED ORDER — PROPOFOL 10 MG/ML IV BOLUS
INTRAVENOUS | Status: DC | PRN
  Administered 2013-03-31: 100 mg via INTRAVENOUS
  Administered 2013-03-31: 200 mg via INTRAVENOUS

## 2013-03-31 MED ORDER — STERILE WATER FOR IRRIGATION IR SOLN
Status: DC | PRN
  Administered 2013-03-31: 1000 mL

## 2013-03-31 MED ORDER — MEPERIDINE HCL 25 MG/ML IJ SOLN: 6.2500 mg | INTRAMUSCULAR | Status: DC | PRN

## 2013-03-31 MED ORDER — LIDOCAINE HCL (CARDIAC) 20 MG/ML IV SOLN
INTRAVENOUS | Status: DC | PRN
  Administered 2013-03-31: 60 mg via INTRAVENOUS

## 2013-03-31 MED ORDER — ONDANSETRON HCL 4 MG PO TABS: 4.0000 mg | ORAL_TABLET | Freq: Four times a day (QID) | ORAL | Status: DC | PRN

## 2013-03-31 MED ORDER — KETOROLAC TROMETHAMINE 30 MG/ML IJ SOLN: 15.0000 mg | Freq: Four times a day (QID) | INTRAMUSCULAR | Status: DC

## 2013-03-31 MED ORDER — PROPOFOL 10 MG/ML IV EMUL
INTRAVENOUS | Status: AC
  Filled 2013-03-31: qty 20

## 2013-03-31 MED ORDER — ONDANSETRON HCL 4 MG/2ML IJ SOLN
INTRAMUSCULAR | Status: AC
  Filled 2013-03-31: qty 2

## 2013-03-31 MED ORDER — MAGNESIUM HYDROXIDE 400 MG/5ML PO SUSP
30.0000 mL | Freq: Two times a day (BID) | ORAL | Status: AC
  Administered 2013-03-31 – 2013-04-01 (×3): 30 mL via ORAL
  Filled 2013-03-31 (×3): qty 30

## 2013-03-31 MED ORDER — HYDROMORPHONE HCL PF 1 MG/ML IJ SOLN
0.2000 mg | INTRAMUSCULAR | Status: DC | PRN
  Administered 2013-03-31: 0.6 mg via INTRAVENOUS
  Filled 2013-03-31: qty 1

## 2013-03-31 MED ORDER — FENTANYL CITRATE 0.05 MG/ML IJ SOLN
INTRAMUSCULAR | Status: AC
  Filled 2013-03-31: qty 4

## 2013-03-31 MED ORDER — ROCURONIUM BROMIDE 50 MG/5ML IV SOLN
INTRAVENOUS | Status: AC
  Filled 2013-03-31: qty 1

## 2013-03-31 MED ORDER — ACETAMINOPHEN 500 MG PO TABS
1000.0000 mg | ORAL_TABLET | Freq: Four times a day (QID) | ORAL | Status: DC
Start: 2013-03-31 — End: 2013-04-03
  Administered 2013-03-31 – 2013-04-03 (×10): 1000 mg via ORAL
  Filled 2013-03-31 (×10): qty 2

## 2013-03-31 MED ORDER — LACTATED RINGERS IV SOLN
INTRAVENOUS | Status: DC
  Administered 2013-03-31 (×5): via INTRAVENOUS

## 2013-03-31 MED ORDER — MENTHOL 3 MG MT LOZG: 1.0000 | LOZENGE | OROMUCOSAL | Status: DC | PRN

## 2013-03-31 MED ORDER — HYDROMORPHONE HCL PF 1 MG/ML IJ SOLN
INTRAMUSCULAR | Status: DC | PRN
  Administered 2013-03-31 (×2): 0.5 mg via INTRAVENOUS

## 2013-03-31 MED ORDER — SIMETHICONE 80 MG PO CHEW
80.0000 mg | CHEWABLE_TABLET | Freq: Four times a day (QID) | ORAL | Status: DC
  Administered 2013-03-31 – 2013-04-03 (×11): 80 mg via ORAL

## 2013-03-31 MED ORDER — MIDAZOLAM HCL 2 MG/2ML IJ SOLN
INTRAMUSCULAR | Status: AC
  Filled 2013-03-31: qty 2

## 2013-03-31 MED ORDER — GLYCOPYRROLATE 0.2 MG/ML IJ SOLN
INTRAMUSCULAR | Status: AC
  Filled 2013-03-31: qty 3

## 2013-03-31 MED ORDER — FENTANYL CITRATE 0.05 MG/ML IJ SOLN
INTRAMUSCULAR | Status: DC | PRN
  Administered 2013-03-31 (×3): 50 ug via INTRAVENOUS
  Administered 2013-03-31: 100 ug via INTRAVENOUS
  Administered 2013-03-31: 50 ug via INTRAVENOUS
  Administered 2013-03-31: 100 ug via INTRAVENOUS
  Administered 2013-03-31: 50 ug via INTRAVENOUS

## 2013-03-31 MED ORDER — DOCUSATE SODIUM 100 MG PO CAPS: 100.0000 mg | ORAL_CAPSULE | Freq: Two times a day (BID) | ORAL | Status: DC

## 2013-03-31 MED ORDER — SODIUM CHLORIDE 0.45 % IV SOLN: INTRAVENOUS | Status: DC

## 2013-03-31 MED ORDER — KETOROLAC TROMETHAMINE 30 MG/ML IJ SOLN
15.0000 mg | Freq: Four times a day (QID) | INTRAMUSCULAR | Status: DC
  Administered 2013-03-31: 15 mg via INTRAVENOUS
  Filled 2013-03-31: qty 1

## 2013-03-31 MED ORDER — IBUPROFEN 600 MG PO TABS
600.0000 mg | ORAL_TABLET | Freq: Four times a day (QID) | ORAL | Status: DC
  Administered 2013-04-01 – 2013-04-03 (×8): 600 mg via ORAL
  Filled 2013-03-31 (×8): qty 1

## 2013-03-31 MED ORDER — BUPIVACAINE LIPOSOME 1.3 % IJ SUSP
20.0000 mL | Freq: Once | INTRAMUSCULAR | Status: AC
  Administered 2013-03-31: 20 mL
  Filled 2013-03-31: qty 20

## 2013-03-31 MED ORDER — KETOROLAC TROMETHAMINE 30 MG/ML IJ SOLN
INTRAMUSCULAR | Status: AC
  Filled 2013-03-31: qty 1

## 2013-03-31 MED ORDER — PANTOPRAZOLE SODIUM 40 MG PO TBEC
40.0000 mg | DELAYED_RELEASE_TABLET | Freq: Every day | ORAL | Status: DC
  Filled 2013-03-31 (×2): qty 1

## 2013-03-31 MED ORDER — SCOPOLAMINE 1 MG/3DAYS TD PT72
MEDICATED_PATCH | TRANSDERMAL | Status: AC
  Administered 2013-03-31: 1.5 mg via TRANSDERMAL
  Filled 2013-03-31: qty 1

## 2013-03-31 MED ORDER — HYDROMORPHONE HCL PF 1 MG/ML IJ SOLN
0.2500 mg | INTRAMUSCULAR | Status: DC | PRN
  Administered 2013-03-31: 0.5 mg via INTRAVENOUS

## 2013-03-31 MED ORDER — ACETAMINOPHEN 500 MG PO TABS: 1000.0000 mg | ORAL_TABLET | Freq: Four times a day (QID) | ORAL | Status: DC | PRN

## 2013-03-31 MED ORDER — CEFAZOLIN SODIUM-DEXTROSE 2-3 GM-% IV SOLR
2.0000 g | INTRAVENOUS | Status: AC
  Administered 2013-03-31: 2 g via INTRAVENOUS

## 2013-03-31 MED ORDER — FENTANYL CITRATE 0.05 MG/ML IJ SOLN
INTRAMUSCULAR | Status: AC
  Filled 2013-03-31: qty 5

## 2013-03-31 MED ORDER — HYDROMORPHONE HCL PF 1 MG/ML IJ SOLN
INTRAMUSCULAR | Status: AC
  Filled 2013-03-31: qty 1

## 2013-03-31 MED ORDER — NEOSTIGMINE METHYLSULFATE 1 MG/ML IJ SOLN
INTRAMUSCULAR | Status: AC
  Filled 2013-03-31: qty 1

## 2013-03-31 MED ORDER — SODIUM CHLORIDE 0.45 % IV SOLN
INTRAVENOUS | Status: DC
  Administered 2013-03-31: 20:00:00 via INTRAVENOUS

## 2013-03-31 MED ORDER — KETOROLAC TROMETHAMINE 30 MG/ML IJ SOLN
INTRAMUSCULAR | Status: DC | PRN
  Administered 2013-03-31: 30 mg via INTRAVENOUS

## 2013-03-31 MED ORDER — SIMETHICONE 80 MG PO CHEW: 80.0000 mg | CHEWABLE_TABLET | Freq: Four times a day (QID) | ORAL | Status: DC

## 2013-03-31 MED ORDER — METRONIDAZOLE IN NACL 5-0.79 MG/ML-% IV SOLN
500.0000 mg | Freq: Once | INTRAVENOUS | Status: AC
  Administered 2013-03-31: 500 mg via INTRAVENOUS
  Filled 2013-03-31: qty 100

## 2013-03-31 MED ORDER — HYDROMORPHONE HCL 2 MG PO TABS
2.0000 mg | ORAL_TABLET | ORAL | Status: DC | PRN
  Administered 2013-03-31: 2 mg via ORAL
  Administered 2013-03-31 – 2013-04-01 (×3): 4 mg via ORAL
  Administered 2013-04-01 – 2013-04-03 (×3): 2 mg via ORAL
  Filled 2013-03-31: qty 1
  Filled 2013-03-31: qty 2
  Filled 2013-03-31: qty 1
  Filled 2013-03-31 (×2): qty 2
  Filled 2013-03-31: qty 1
  Filled 2013-03-31: qty 2

## 2013-03-31 MED ORDER — MAGNESIUM HYDROXIDE 400 MG/5ML PO SUSP: 30.0000 mL | Freq: Two times a day (BID) | ORAL | Status: DC

## 2013-03-31 MED ORDER — SCOPOLAMINE 1 MG/3DAYS TD PT72: 1.0000 | MEDICATED_PATCH | TRANSDERMAL | Status: DC

## 2013-03-31 MED ORDER — ONDANSETRON HCL 4 MG/2ML IJ SOLN
INTRAMUSCULAR | Status: DC | PRN
  Administered 2013-03-31: 4 mg via INTRAVENOUS

## 2013-03-31 MED ORDER — DEXAMETHASONE SODIUM PHOSPHATE 10 MG/ML IJ SOLN
INTRAMUSCULAR | Status: AC
  Filled 2013-03-31: qty 1

## 2013-03-31 SURGICAL SUPPLY — 50 items
BENZOIN TINCTURE PRP APPL 2/3 (GAUZE/BANDAGES/DRESSINGS) IMPLANT
CANISTER SUCTION 2500CC (MISCELLANEOUS) ×3 IMPLANT
CATH FOLEY 3WAY  5CC 16FR (CATHETERS)
CATH FOLEY 3WAY 5CC 16FR (CATHETERS) IMPLANT
CELLS DAT CNTRL 66122 CELL SVR (MISCELLANEOUS) IMPLANT
CHLORAPREP W/TINT 26ML (MISCELLANEOUS) ×3 IMPLANT
CLIP LIGATING EXTRA MED SLVR (CLIP) ×3 IMPLANT
CLOTH BEACON ORANGE TIMEOUT ST (SAFETY) ×3 IMPLANT
CONT PATH 165OZ SNAP LID (FORM) ×3 IMPLANT
CONT PATH 16OZ SNAP LID 3702 (MISCELLANEOUS) IMPLANT
DECANTER SPIKE VIAL GLASS SM (MISCELLANEOUS) IMPLANT
DRAPE UNDERBUTTOCKS STRL (DRAPE) ×3 IMPLANT
DRESSING TELFA 8X3 (GAUZE/BANDAGES/DRESSINGS) ×3 IMPLANT
ELECT BLADE 6 FLAT ULTRCLN (ELECTRODE) ×3 IMPLANT
GAUZE SPONGE 4X4 12PLY STRL LF (GAUZE/BANDAGES/DRESSINGS) ×3 IMPLANT
GAUZE SPONGE 4X4 16PLY XRAY LF (GAUZE/BANDAGES/DRESSINGS) ×3 IMPLANT
GLOVE BIO SURGEON STRL SZ 6.5 (GLOVE) ×3 IMPLANT
GOWN PREVENTION PLUS LG XLONG (DISPOSABLE) ×9 IMPLANT
NEEDLE HYPO 18GX1.5 BLUNT FILL (NEEDLE) ×3 IMPLANT
NEEDLE HYPO 25X1 1.5 SAFETY (NEEDLE) ×6 IMPLANT
NS IRRIG 1000ML POUR BTL (IV SOLUTION) ×3 IMPLANT
PACK ABDOMINAL GYN (CUSTOM PROCEDURE TRAY) ×3 IMPLANT
PAD ABD 7.5X8 STRL (GAUZE/BANDAGES/DRESSINGS) ×3 IMPLANT
PAD MAGNETIC INST (MISCELLANEOUS) ×3 IMPLANT
PAD OB MATERNITY 4.3X12.25 (PERSONAL CARE ITEMS) ×3 IMPLANT
PLUG CATH AND CAP STER (CATHETERS) IMPLANT
PROTECTOR NERVE ULNAR (MISCELLANEOUS) IMPLANT
RETRACTOR WND ALEXIS 25 LRG (MISCELLANEOUS) IMPLANT
RTRCTR WOUND ALEXIS 18CM MED (MISCELLANEOUS)
RTRCTR WOUND ALEXIS 25CM LRG (MISCELLANEOUS)
SCRUB PCMX 4 OZ (MISCELLANEOUS) ×6 IMPLANT
SEPRAFILM MEMBRANE 5X6 (MISCELLANEOUS) IMPLANT
SHEET LAVH (DRAPES) ×3 IMPLANT
SPONGE LAP 18X18 X RAY DECT (DISPOSABLE) ×3 IMPLANT
STAPLER VISISTAT 35W (STAPLE) IMPLANT
STRIP CLOSURE SKIN 1/2X4 (GAUZE/BANDAGES/DRESSINGS) IMPLANT
SUT MNCRL AB 3-0 PS2 27 (SUTURE) IMPLANT
SUT MON AB 2-0 CT1 27 (SUTURE) ×3 IMPLANT
SUT MON AB 3-0 SH 27 (SUTURE)
SUT MON AB 3-0 SH27 (SUTURE) IMPLANT
SUT PDS AB 1 CTX 36 (SUTURE) ×6 IMPLANT
SUT PLAIN 2 0 XLH (SUTURE) IMPLANT
SUT VIC AB 0 CT1 27 (SUTURE) ×3
SUT VIC AB 0 CT1 27XCR 8 STRN (SUTURE) ×6 IMPLANT
SUT VICRYL 0 TIES 12 18 (SUTURE) ×3 IMPLANT
SYR 20CC LL (SYRINGE) ×3 IMPLANT
SYR CONTROL 10ML LL (SYRINGE) ×6 IMPLANT
TOWEL OR 17X24 6PK STRL BLUE (TOWEL DISPOSABLE) ×6 IMPLANT
TRAY FOLEY CATH 14FR (SET/KITS/TRAYS/PACK) ×3 IMPLANT
WATER STERILE IRR 1000ML POUR (IV SOLUTION) ×3 IMPLANT

## 2013-03-31 NOTE — Interval H&P Note (Signed)
History and Physical Interval Note:  03/31/2013 9:20 AM  Kerri Sims  has presented today for surgery, with the diagnosis of Uterine fibroids  The various methods of treatment have been discussed with the patient and family. After consideration of risks, benefits and other options for treatment, the patient has consented to  Procedure(s): HYSTERECTOMY ABDOMINAL (N/A) BILATERAL SALPINGECTOMY (Bilateral) as a surgical intervention .  The patient's history has been reviewed, patient examined, no change in status, stable for surgery.  I have reviewed the patient's chart and labs.  Questions were answered to the patient's satisfaction.     JACKSON-MOORE,Whitnie Deleon A

## 2013-03-31 NOTE — Progress Notes (Signed)
Emptied 450cc bright red fluid from foley catheter.  MD notified, no new orders at this time.  Will continue to monitor.  Osvaldo Angst, RN-----

## 2013-03-31 NOTE — Anesthesia Postprocedure Evaluation (Signed)
Anesthesia Post Note  Patient: Kerri Sims  Procedure(s) Performed: Procedure(s) (LRB): HYSTERECTOMY ABDOMINAL (N/A) BILATERAL SALPINGECTOMY (Bilateral)  Anesthesia type: General  Patient location: PACU  Post pain: Pain level controlled  Post assessment: Post-op Vital signs reviewed  Last Vitals:  Filed Vitals:   03/31/13 1300  BP: 131/70  Pulse: 76  Temp: 36.9 C  Resp: 16    Post vital signs: Reviewed  Level of consciousness: sedated  Complications: No apparent anesthesia complications

## 2013-03-31 NOTE — Anesthesia Preprocedure Evaluation (Signed)
Anesthesia Evaluation    History of Anesthesia Complications (+) PONV  Airway Mallampati: III TM Distance: >3 FB Neck ROM: Full    Dental no notable dental hx. (+) Teeth Intact   Pulmonary neg pulmonary ROS,  breath sounds clear to auscultation  Pulmonary exam normal       Cardiovascular negative cardio ROS  Rhythm:Regular Rate:Normal     Neuro/Psych negative neurological ROS  negative psych ROS   GI/Hepatic negative GI ROS, Neg liver ROS,   Endo/Other  negative endocrine ROS  Renal/GU negative Renal ROS  negative genitourinary   Musculoskeletal negative musculoskeletal ROS (+)   Abdominal Normal abdominal exam  (+) + obese,   Peds  Hematology negative hematology ROS (+)   Anesthesia Other Findings   Reproductive/Obstetrics Uterine Fibroids                           Anesthesia Physical Anesthesia Plan  ASA: II  Anesthesia Plan: General   Post-op Pain Management:    Induction: Intravenous  Airway Management Planned: Oral ETT  Additional Equipment:   Intra-op Plan:   Post-operative Plan:   Informed Consent: I have reviewed the patients History and Physical, chart, labs and discussed the procedure including the risks, benefits and alternatives for the proposed anesthesia with the patient or authorized representative who has indicated his/her understanding and acceptance.   Dental advisory given  Plan Discussed with: Anesthesiologist, CRNA and Surgeon  Anesthesia Plan Comments:         Anesthesia Quick Evaluation

## 2013-03-31 NOTE — Op Note (Signed)
Hysterectomy Procedure Note  Indications: Symptomatic uterine fibroids  Pre-operative Diagnosis: Symptomatic uterine fibroids  Post-operative Diagnosis: Same  Operation: Total abdominal hysterectomy, bilateral salpingectomies  Surgeon: Antionette Char A   Assistants: Coral Ceo A  Anesthesia: General endotracheal anesthesia  ASA Class: 1  Procedure Details  The patient was seen in the Holding Room. The risks, benefits, complications, treatment options, and expected outcomes were discussed with the patient.  The patient concurred with the proposed plan, giving informed consent.  The site of surgery properly noted/marked. The patient was taken to Operating Room # 8, identified as Javonna Balli and the procedure verified as Total abdominal hysterectomy, bilateral salpingectomies. A Time Out was held and the above information confirmed.  Patient brought to the operating room and after satisfactory attainment of general anesthesia was placed in a modified lithotomy position in [place] stirrups. Anterior abdominal wall perineum and vagina were prepped a Foley catheter was inserted the patient was draped. Antibiotics were administered. The abdomen was entered through midline incision. The upper abdomen and the pelvis were explored with the findings noted below. A Bookwalter retractor was positioned and the small bowel packed out of the pelvis. The left retroperitoneal space was opened  A window was made in the broad ligament.  The left utero-ovarian ligament and proximal fallopian tube were grasped, cut and suture ligated with 0-Vicryl.clamped cut free tied and suture ligated.    Attention was turned to the right side of the pelvis. The right retroperitoneal space was opened.  The right utero-ovarian ligament and proximal fallopian tube were grasped, cut, and suture ligated with 0-Vicryl.clamped, cut free tied and suture ligated. The bladder flap was advanced with sharp and blunt dissection. In  a stepwise fashion the uterine vessels were skeletonized clamped cut and suture ligated. The uterine fundus was amputated.  The cardinal ligaments and paracervical tissues were clamped cut and suture ligated. The vaginal angles were crossclamped and the vagina transected from its connection to the cervix. The vaginal angles were transfixed with 0 Vicryl. The central portion vagina closed the interrupted sutures of 0 Vicryl. The right fallopian tube was grasped.  The mesosalpinx was clamped, cut and suture ligated with 0 Vicryl.  The left fallopian tube was manipulated in a similar fashion.  The pelvis was irrigated. Hemostasis achieved with cautery.  The anterior, wall was closed in layers first in a running mass closure using 0- PDS. A running suture of of 2-0 Monocryl was placed in the subcutaneous tissue.  Subcutaneous tissue was irrigated and hemostasis was achieved with cautery. Exparel was then infiltrated into the wound.  The skin closed with skin staples. A dressing was applied. Patient was awakened from anesthesia and taken to recovery in satisfactory condition sponge needle and instrument counts correct x2   Findings: Uterus with diffuse fibroid involvement  Estimated Blood Loss:  1000 ml         Drains: None         Total IV Fluids: per Anesthesiology         Specimens: To Pathology         Implants: None         Complications:  None; patient tolerated the procedure well.         Disposition: PACU - hemodynamically stable.         Condition: stable

## 2013-03-31 NOTE — Preoperative (Addendum)
Beta Blockers   Reason not to administer Beta Blockers:Not Applicable 

## 2013-03-31 NOTE — Transfer of Care (Signed)
Immediate Anesthesia Transfer of Care Note  Patient: Kerri Sims  Procedure(s) Performed: Procedure(s) with comments: HYSTERECTOMY ABDOMINAL (N/A) - fibroids BILATERAL SALPINGECTOMY (Bilateral)  Patient Location: PACU  Anesthesia Type:General  Level of Consciousness: awake, alert  and oriented  Airway & Oxygen Therapy: Patient Spontanous Breathing and Patient connected to nasal cannula oxygen  Post-op Assessment: Report given to PACU RN and Post -op Vital signs reviewed and stable  Post vital signs: Reviewed and stable  Complications: No apparent anesthesia complications

## 2013-04-01 LAB — CBC
HCT: 29.5 % — ABNORMAL LOW (ref 36.0–46.0)
Hemoglobin: 10 g/dL — ABNORMAL LOW (ref 12.0–15.0)
MCH: 29.9 pg (ref 26.0–34.0)
MCHC: 33.9 g/dL (ref 30.0–36.0)
MCV: 88.1 fL (ref 78.0–100.0)

## 2013-04-01 LAB — BASIC METABOLIC PANEL
BUN: 10 mg/dL (ref 6–23)
Calcium: 8.6 mg/dL (ref 8.4–10.5)
Creatinine, Ser: 0.74 mg/dL (ref 0.50–1.10)
GFR calc non Af Amer: >90 mL/min (ref >90)
Glucose, Bld: 160 mg/dL — ABNORMAL HIGH (ref 70–99)
Potassium: 3.8 mEq/L (ref 3.5–5.1)

## 2013-04-01 NOTE — Progress Notes (Signed)
Patient ID: Kerri Sims, female   DOB: [birth year], 42 y.o.   MRN: [id number] Postop day 1 Vital signs normal Output good Urine now clear no blood noted to the cystogram was not that done with continue draining her bladder today for another 24 hours with a Foley catheter Legs negative doing well

## 2013-04-01 NOTE — Anesthesia Postprocedure Evaluation (Signed)
  Anesthesia Post-op Note  Patient: Kerri Sims  Procedure(s) Performed: Procedure(s) with comments: HYSTERECTOMY ABDOMINAL (N/A) - fibroids BILATERAL SALPINGECTOMY (Bilateral)  Patient Location: [facility]  Anesthesia Type:General  Level of Consciousness: awake, alert  and oriented  Airway and Oxygen Therapy: Patient Spontanous Breathing  Post-op Pain: mild  Post-op Assessment: Post-op Vital signs reviewed and Patient's Cardiovascular Status Stable  Post-op Vital Signs: Reviewed and stable  Complications: No apparent anesthesia complications

## 2013-04-02 NOTE — Progress Notes (Signed)
Patient ID: Kerri Sims, female   DOB: 05-Sep-1971, 42 y.o.   MRN: [id number] Postop day 2 Vital signs normal Urine remains clear was discontinued today legs negative doing well

## 2013-04-03 ENCOUNTER — Encounter (HOSPITAL_COMMUNITY): Payer: Self-pay | Admitting: Obstetrics & Gynecology

## 2013-04-03 MED ORDER — HYDROMORPHONE HCL 2 MG PO TABS: 2.0000 mg | ORAL_TABLET | ORAL | Status: DC | PRN

## 2013-04-03 NOTE — Progress Notes (Deleted)
Subjective: Postpartum Day 3: Cesarean Delivery Patient reports tolerating PO, + flatus and no problems voiding.    Objective: Vital signs in last 24 hours: Temp:  [98.3 F (36.8 C)-98.7 F (37.1 C)] 98.7 F (37.1 C) (07/28 0615) Pulse Rate:  [71-97] 75 (07/28 0615) Resp:  [16-20] 18 (07/28 0615) BP: (114-123)/(71-79) 123/76 mmHg (07/28 0615) SpO2:  [99 %-100 %] 100 % (07/28 0615)  Physical Exam:  General: alert and no distress Lochia: appropriate Uterine Fundus: firm Incision: healing well DVT Evaluation: No evidence of DVT seen on physical exam.   Recent Labs  03/31/13 1525 04/01/13 0505  HGB 11.7* 10.0*  HCT 35.0* 29.5*    Assessment/Plan: Status post Cesarean section. Doing well postoperatively.  Discharge home with standard precautions and return to clinic in 2 weeks.  HARPER,CHARLES A 04/03/2013, 8:40 AM

## 2013-04-03 NOTE — Discharge Summary (Signed)
Physician Discharge Summary  Patient ID: Kerri Sims MRN: [id number] DOB/AGE: July 17, 1971 42 y.o.  Admit date: 03/31/2013 Discharge date: 04/03/2013  Admission Diagnoses:  Symptomatic Uterine Fibroids  Discharge Diagnoses:   Same Active Problems:   * No active hospital problems. *   Discharged Condition: good  Hospital Course: Underwent TAH ./ BS for fibroids.  No compications.  Post op uncomplicated.  Discharged home.  Consults: None  Significant Diagnostic Studies: labs: CBC, CMET   Treatments: IV hydration, TAH / BS  Discharge Exam: Blood pressure 123/76, pulse 75, temperature 98.7 F (37.1 C), temperature source Oral, resp. rate 18, height 5\' 2"  (1.575 m), weight 182 lb (82.555 kg), last menstrual period 03/26/2013, SpO2 100.00%. General appearance: alert and no distress Resp: clear to auscultation bilaterally Cardio: regular rate and rhythm, S1, S2 normal, no murmur, click, rub or gallop GI: normal findings: soft, non-tender Extremities: extremities normal, atraumatic, no cyanosis or edema Incision/Wound:  C, D, I.  Disposition: 01-Home or Self Care  Discharge Orders   Future Orders Complete By Expires     (HEART FAILURE PATIENTS) Call MD:  Anytime you have any of the following symptoms: 1) 3 pound weight gain in 24 hours or 5 pounds in 1 week 2) shortness of breath, with or without a dry hacking cough 3) swelling in the hands, feet or stomach 4) if you have to sleep on extra pillows at night in order to breathe.  As directed     Call MD for:  difficulty breathing, headache or visual disturbances  As directed     Call MD for:  extreme fatigue  As directed     Call MD for:  hives  As directed     Call MD for:  persistant dizziness or light-headedness  As directed     Call MD for:  persistant nausea and vomiting  As directed     Call MD for:  redness, tenderness, or signs of infection (pain, swelling, redness, odor or green/yellow discharge around incision site)  As  directed     Call MD for:  severe uncontrolled pain  As directed     Call MD for:  temperature >100.4  As directed     Call MD for:  As directed     Diet - low sodium heart healthy  As directed     Discharge instructions  As directed     Comments:      Routine    Driving Restrictions  As directed     Comments:      No driving for 2 weeks.    Increase activity slowly  As directed     Lifting restrictions  As directed     Comments:      No lifting > 20 lbs.    Sexual Activity Restrictions  As directed     Comments:      No sex for 6 weeks.        Medication List    STOP taking these medications       norethindrone 5 MG tablet  Commonly known as:  AYGESTIN     TYLENOL PO      TAKE these medications       FUSION PLUS Caps  Take 1 capsule by mouth every morning.           Follow-up Information   Follow up with Antionette Char A, MD. Schedule an appointment as soon as possible for a visit in 2 weeks.   Contact information:  [address] [place] [address] [phone]       Signed: Brock Bad 04/03/2013, 8:53 AM

## 2013-04-03 NOTE — Progress Notes (Signed)
Pt to return  To office to have staple removed   Thursday   Prescription for motrin called from office to [facility]   Pharmacy  For pt   Pt out in wheelchair

## 2013-04-03 NOTE — Progress Notes (Signed)
3 Days Post-Op Procedure(s) (LRB): HYSTERECTOMY ABDOMINAL (N/A) BILATERAL SALPINGECTOMY (Bilateral)  Subjective: Patient reports tolerating PO, + flatus, + BM and no problems voiding.    Objective: I have reviewed patient's vital signs, intake and output, medications, labs and microbiology.  General: alert and no distress Resp: clear to auscultation bilaterally Cardio: regular rate and rhythm, S1, S2 normal, no murmur, click, rub or gallop GI: normal findings: soft, non-tender Extremities: extremities normal, atraumatic, no cyanosis or edema Vaginal Bleeding: none  Assessment: s/p Procedure(s) with comments: HYSTERECTOMY ABDOMINAL (N/A) - fibroids BILATERAL SALPINGECTOMY (Bilateral): stable, progressing well, tolerating diet and ready for discharge  Plan: Discharge home  LOS: 3 days    Elice Crigger A 04/03/2013, 8:45 AM

## 2013-04-06 ENCOUNTER — Encounter: Payer: Self-pay | Admitting: Obstetrics

## 2013-04-06 ENCOUNTER — Ambulatory Visit (INDEPENDENT_AMBULATORY_CARE_PROVIDER_SITE_OTHER): Payer: BC Managed Care – PPO | Admitting: Obstetrics

## 2013-04-06 VITALS — BP 107/71 | HR 89 | Temp 98.9°F | Wt 178.0 lb

## 2013-04-06 DIAGNOSIS — D259 Leiomyoma of uterus, unspecified: Secondary | ICD-10-CM

## 2013-04-06 DIAGNOSIS — G8918 Other acute postprocedural pain: Secondary | ICD-10-CM

## 2013-04-06 MED ORDER — IBUPROFEN 800 MG PO TABS: 800.0000 mg | ORAL_TABLET | Freq: Three times a day (TID) | ORAL | Status: DC | PRN

## 2013-04-06 NOTE — Progress Notes (Signed)
Subjective:     Kerri Sims is a 42 y.o. female who presents to the clinic 1 weeks status post total abdominal hysterectomy for fibroids. Eating a regular diet without difficulty. Bowel movements are normal. Pain is controlled with current analgesics. Medications being used: narcotic analgesics including Dilaudid.  The following portions of the patient's history were reviewed and updated as appropriate: allergies, current medications, past family history, past medical history, past social history, past surgical history and problem list.  Review of Systems Pertinent items are noted in HPI.    Objective:    BP 107/71  Pulse 89  Temp(Src) 98.9 F (37.2 C) (Oral)  Wt 178 lb (80.74 kg)  BMI 32.55 kg/m2  LMP 01/24/2013 General:  alert and no distress  Abdomen: soft, bowel sounds active, non-tender  Incision:   healing well, no drainage, no erythema, no hernia, no seroma, no swelling, no dehiscence, incision well approximated.  Staples removed, steri strips applied.     Assessment:    Doing well postoperatively. Operative findings again reviewed. Pathology report discussed.    Plan:    1. Continue any current medications. 2. Wound care discussed. 3. Activity restrictions: no bending, stooping, or squatting and no lifting more than 20 pounds 4. Anticipated return to work: 7 weeks. 5. Follow up: 2 weeks.

## 2013-04-11 ENCOUNTER — Encounter: Payer: Self-pay | Admitting: Obstetrics

## 2013-04-11 ENCOUNTER — Ambulatory Visit: Payer: BC Managed Care – PPO | Admitting: Obstetrics

## 2013-04-11 ENCOUNTER — Ambulatory Visit (INDEPENDENT_AMBULATORY_CARE_PROVIDER_SITE_OTHER): Payer: BC Managed Care – PPO | Admitting: Obstetrics

## 2013-04-11 VITALS — BP 121/71 | HR 88 | Temp 97.1°F | Wt 181.0 lb

## 2013-04-11 DIAGNOSIS — D259 Leiomyoma of uterus, unspecified: Secondary | ICD-10-CM

## 2013-04-11 NOTE — Progress Notes (Signed)
Subjective:     Kerri Sims is a 42 y.o. female here for incision check.  Current complaints: pain at incision and drainage w/o odor.  Personal health questionnaire reviewed: not asked.   Gynecologic History Patient's last menstrual period was 01/24/2013. Contraception: status post hysterectomy    The following portions of the patient's history were reviewed and updated as appropriate: allergies, current medications, past family history, past medical history, past social history, past surgical history and problem list.  Review of Systems Pertinent items are noted in HPI.    Objective:    General appearance: alert and no distress Abdomen: normal findings: soft, non-tender and incision C, D, I.    Assessment:    Healthy female exam.    Plan:    Follow up in: 2 weeks.

## 2013-04-20 ENCOUNTER — Ambulatory Visit (INDEPENDENT_AMBULATORY_CARE_PROVIDER_SITE_OTHER): Payer: BC Managed Care – PPO | Admitting: Obstetrics & Gynecology

## 2013-04-20 VITALS — BP 125/74 | HR 73 | Temp 98.6°F | Wt 179.0 lb

## 2013-04-20 DIAGNOSIS — G8918 Other acute postprocedural pain: Secondary | ICD-10-CM

## 2013-04-20 MED ORDER — HYDROMORPHONE HCL 2 MG PO TABS: 2.0000 mg | ORAL_TABLET | ORAL | Status: DC | PRN

## 2013-04-20 NOTE — Progress Notes (Signed)
Subjective:     Kerri Sims is a 42 y.o. female who presents to the clinic 3 weeks status post abdominal hysterectomy for fibroids. Eating a regular diet without difficulty. Bowel movements are normal. Pain is controlled with current analgesics. Medications being used: prescription NSAID's including Ibuprofen 800 mg and narcotic analgesics including hydromorphone (Dilaudid).  The following portions of the patient's history were reviewed and updated as appropriate: allergies, current medications, past family history, past medical history, past social history, past surgical history and problem list.  Review of Systems Pertinent items are noted in HPI.    Objective:    BP 125/74  Pulse 73  Temp(Src) 98.6 F (37 C) (Oral)  Wt 179 lb (81.194 kg)  BMI 32.73 kg/m2  LMP 01/24/2013 General:  alert  Abdomen: soft, bowel sounds active, non-tender  Incision:   healing well, no drainage, no erythema, no hernia, no seroma, no swelling, superficial separation, no dehiscence     Assessment:    Doing well postoperatively. Operative findings again reviewed. Pathology report discussed.    Plan:    1. Continue any current medications. 2. Wound care discussed. 3. Activity restrictions: no lifting more than 5 pounds and no intercourse 4. Anticipated return to work: 2-3 weeks. 5. Follow up: 2 weeks.

## 2013-04-24 ENCOUNTER — Encounter: Payer: Self-pay | Admitting: Obstetrics & Gynecology

## 2013-04-24 ENCOUNTER — Encounter: Payer: BC Managed Care – PPO | Admitting: Obstetrics

## 2013-05-03 ENCOUNTER — Encounter: Payer: Self-pay | Admitting: Obstetrics & Gynecology

## 2013-05-03 ENCOUNTER — Ambulatory Visit (INDEPENDENT_AMBULATORY_CARE_PROVIDER_SITE_OTHER): Payer: BC Managed Care – PPO | Admitting: Obstetrics & Gynecology

## 2013-05-03 VITALS — BP 109/74 | HR 80 | Temp 98.2°F | Ht 62.0 in | Wt 180.2 lb

## 2013-05-03 DIAGNOSIS — D259 Leiomyoma of uterus, unspecified: Secondary | ICD-10-CM

## 2013-05-03 NOTE — Progress Notes (Signed)
.   Subjective:     Kerri Sims is a 42 y.o. female who presents to the clinic 5 weeks status post total abdominal hysterectomy for fibroids. Eating a regular diet without difficulty. Bowel movements are normal. Pain is controlled with current analgesics. Medications being used: narcotic analgesics including hydromorphone (Dilaudid).  The following portions of the patient's history were reviewed and updated as appropriate: allergies, current medications, past family history, past medical history, past social history, past surgical history and problem list.  Review of Systems Pertinent items are noted in HPI.    Objective:    Ht 5\' 2"  (1.575 m)  Wt 180 lb 3.2 oz (81.738 kg)  BMI 32.95 kg/m2  LMP 01/24/2013 General:  alert and no distress  Abdomen: soft, non-tender,   Incision:   no drainage, no erythema, no hernia, no seroma, no swelling, no dehiscence, incision well approximated     Assessment:    Doing well postoperatively. Operative findings again reviewed. Pathology report discussed.    Plan:    1. Continue any current medications. 2. Wound care discussed. 3. Activity restrictions: no bending, stooping, or squatting and no lifting more than 30 pounds 4. Anticipated return to work: 2-3 weeks. 5. Follow up: Annual

## 2013-05-04 ENCOUNTER — Encounter: Payer: Self-pay | Admitting: Obstetrics & Gynecology

## 2013-05-17 ENCOUNTER — Ambulatory Visit (INDEPENDENT_AMBULATORY_CARE_PROVIDER_SITE_OTHER): Payer: BC Managed Care – PPO | Admitting: Obstetrics & Gynecology

## 2013-05-17 VITALS — BP 105/70 | HR 76 | Temp 99.4°F

## 2013-05-17 DIAGNOSIS — Z09 Encounter for follow-up examination after completed treatment for conditions other than malignant neoplasm: Secondary | ICD-10-CM

## 2013-05-17 NOTE — Progress Notes (Signed)
Subjective:     Kerri Sims is a 42 y.o. female who presents to the clinic 6 weeks status post total abdominal hysterectomy for fibroids. Eating a regular diet with difficulty. Bowel movements are normal. The patient is not having any pain. Patient states she is doing well and reports her incision is healing without problem.  The following portions of the patient's history were reviewed and updated as appropriate: allergies, current medications, past family history, past medical history, past social history, past surgical history and problem list.  Review of Systems Pertinent items are noted in HPI.    Objective:    BP 105/70  Pulse 76  Temp(Src) 99.4 F (37.4 C)  LMP 01/24/2013 General:  alert  Abdomen: soft, bowel sounds active, non-tender  Incision:   healing well, no drainage, no erythema, no hernia, no seroma, no swelling, no dehiscence, incision well approximated    Pelvic:  Vaginal cuff well-healed; adnexa NT Assessment:    Doing well postoperatively.   Plan:    Continue any current medications. Follow up: 6 weeks.

## 2013-05-18 ENCOUNTER — Encounter: Payer: Self-pay | Admitting: Obstetrics & Gynecology

## 2013-06-29 ENCOUNTER — Ambulatory Visit (INDEPENDENT_AMBULATORY_CARE_PROVIDER_SITE_OTHER): Payer: BC Managed Care – PPO | Admitting: Obstetrics & Gynecology

## 2013-06-29 ENCOUNTER — Encounter: Payer: Self-pay | Admitting: Obstetrics & Gynecology

## 2013-06-29 VITALS — BP 138/85 | HR 81 | Temp 98.4°F | Ht 62.0 in | Wt 184.0 lb

## 2013-06-29 DIAGNOSIS — Z23 Encounter for immunization: Secondary | ICD-10-CM

## 2013-06-29 NOTE — Progress Notes (Signed)
Subjective:     Kerri Sims is a 42 y.o. female here for a routine exam.  Current complaints: Patient states she had her hysterectomy in 03/2013. Patient reports she is still using her iron supplements.Patient reports no problems or concerns today. .  Personal health questionnaire reviewed: no.   Gynecologic History Patient's last menstrual period was 01/24/2013. Contraception: status post hysterectomy  Obstetric History OB History  No data available     The following portions of the patient's history were reviewed and updated as appropriate: allergies, current medications, past family history, past medical history, past social history, past surgical history and problem list.  Review of Systems Pertinent items are noted in HPI.    Objective:   General:  alert     Abdomen: soft, non-tender; bowel sounds normal; no masses,  no organomegaly   Vulva:  normal  Vagina: normal vagina/cuff        Adnexa:  normal adnexa    Assessment:    Healthy postoperative exam.    Plan:    Follow up in: 3 months.

## 2013-06-30 ENCOUNTER — Encounter: Payer: Self-pay | Admitting: Obstetrics & Gynecology

## 2013-09-28 ENCOUNTER — Ambulatory Visit: Payer: BC Managed Care – PPO | Admitting: Obstetrics & Gynecology

## 2013-12-21 ENCOUNTER — Ambulatory Visit: Payer: Self-pay | Admitting: Family Medicine

## 2013-12-21 VITALS — BP 124/76 | HR 67 | Temp 98.6°F | Resp 17 | Ht 62.0 in | Wt 186.0 lb

## 2013-12-21 DIAGNOSIS — Z23 Encounter for immunization: Secondary | ICD-10-CM

## 2013-12-21 NOTE — Patient Instructions (Signed)
Good to see you today!  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  If you are required to have a 2nd MMR shot in a year, you could also consider having a titer drawn to see if you are already immune to these infections  

## 2013-12-21 NOTE — Progress Notes (Signed)
[facility] [address], [place] [address] [phone]  Date:  12/21/2013   Name:  Kerri Sims   DOB:  [birth year]   MRN:  [id number]  PCP:  Pcp Not In System    Chief Complaint: Immunizations   History of Present Illness:  Kerri Sims is a 43 y.o. very pleasant female patient who presents with the following:  She is here today to have immunizations.   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   She has never had a Tdap as far as she knows.  They think she has had MMR as a child, but she was born in [place] and they do not have these records.   She is generally healthy Unsure date of last tetanus shot  There are no active problems to display for this patient.   Past Medical History  Diagnosis Date  . PONV (postoperative nausea and vomiting)   . Medical history non-contributory     Past Surgical History  Procedure Laterality Date  . Cesarean section    . Abdominal hysterectomy N/A 03/31/2013    Procedure: HYSTERECTOMY ABDOMINAL;  Surgeon: Lahoma Crocker, MD;  Location: [facility];  Service: Gynecology;  Laterality: N/A;  fibroids  . Bilateral salpingectomy Bilateral 03/31/2013    Procedure: BILATERAL SALPINGECTOMY;  Surgeon: Lahoma Crocker, MD;  Location: [facility];  Service: Gynecology;  Laterality: Bilateral;    History  Substance Use Topics  . Smoking status: Never Smoker   . Smokeless tobacco: Not on file  . Alcohol Use: No    Family History  Problem Relation Age of Onset  . Hyperlipidemia Mother   . Hypertension Mother   . Stroke Father     Allergies  Allergen Reactions  . Percocet [Oxycodone-Acetaminophen] Itching    Medication list has been reviewed and updated.  Current Outpatient Prescriptions on File Prior to Visit  Medication Sig Dispense Refill  . Iron-FA-B Cmp-C-Biot-Probiotic (FUSION PLUS) CAPS Take 1 capsule by mouth every  morning.  30 capsule  5   No current facility-administered medications on file prior to visit.    Review of Systems:  As per HPI- otherwise negative.   Physical Examination: Filed Vitals:   12/21/13 1007  BP: 124/76  Pulse: 104  Temp: 98.6 F (37 C)  Resp: 17   Filed Vitals:   12/21/13 1007  Height: _0  (1.575 m)  Weight: 186 lb (84.369 kg)   Body mass index is 34.01 kg/(m^2). Ideal Body Weight: Weight in (lb) to have BMI = 25: 136.4  GEN: WDWN, NAD, Non-toxic, A & O x 3, overweight, looks well HEENT: Atraumatic, Normocephalic. Neck supple. No masses, No LAD. Ears and Nose: No external deformity. CV: RRR, No M/G/R. No JVD. No thrill. No extra heart sounds. PULM: CTA B, no wheezes, crackles, rhonchi. No retractions. No resp. distress. No accessory muscle use.Marland Kitchen EXTR: No c/c/e NEURO Normal gait.  PSYCH: Normally interactive. Conversant. Not depressed or anxious appearing.  Calm demeanor.    Assessment and Plan: Immunization due - Plan: Tdap vaccine greater than or equal to 7yo IM, MMR vaccine subcutaneous  tdap and mmr today.  Follow-up as needed  Signed Lamar Blinks, MD

## 2014-02-14 ENCOUNTER — Ambulatory Visit (INDEPENDENT_AMBULATORY_CARE_PROVIDER_SITE_OTHER): Payer: Self-pay | Admitting: Physician Assistant

## 2014-02-14 VITALS — BP 108/72 | HR 102 | Temp 98.4°F | Resp 20 | Ht 62.0 in | Wt 181.6 lb

## 2014-02-14 DIAGNOSIS — Z23 Encounter for immunization: Secondary | ICD-10-CM

## 2014-02-15 NOTE — Progress Notes (Signed)
Patient here for nursing visit only.  The patient was not seen by a provider.  Here for immunization only

## 2014-07-20 ENCOUNTER — Ambulatory Visit (INDEPENDENT_AMBULATORY_CARE_PROVIDER_SITE_OTHER): Payer: Self-pay | Admitting: Family Medicine

## 2014-07-20 ENCOUNTER — Ambulatory Visit (INDEPENDENT_AMBULATORY_CARE_PROVIDER_SITE_OTHER): Payer: Self-pay

## 2014-07-20 VITALS — BP 106/82 | HR 98 | Temp 99.0°F | Resp 18 | Ht 63.0 in | Wt 183.0 lb

## 2014-07-20 DIAGNOSIS — Z0000 Encounter for general adult medical examination without abnormal findings: Secondary | ICD-10-CM

## 2014-07-20 NOTE — Progress Notes (Signed)
   Subjective:    Patient ID: Kerri Sims, female    DOB: 09-03-1971, 43 y.o.   MRN: [id number] This chart was scribed for Robyn Haber, MD by Zola Button, Medical Scribe. This patient was seen in Room 13 and the patient's care was started at 4:50 PM.   HPI HPI Comments: Kerri Sims is a 43 y.o. female who [facility] for [facility] complete [facility]. [facility]   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  Immunizations reviewed, up to date so far (requires next two hepatitis B and second varicella) Review of Systems  Constitutional: Negative for fever.  HENT: Negative for ear discharge.   Eyes: Negative for discharge.  Respiratory: Negative for cough.   Cardiovascular: Negative for chest pain.  Gastrointestinal: Negative for diarrhea.  Genitourinary: Negative for hematuria.  Musculoskeletal: Negative for back pain.  Skin: Negative for rash.  Neurological: Negative for seizures.  Psychiatric/Behavioral: Negative for hallucinations.       Objective:   Physical Exam CONSTITUTIONAL: Well developed/well nourished HEAD: Normocephalic/atraumatic EYES: EOM/PERRL ENMT: Mucous membranes moist NECK: supple no meningeal signs SPINE: entire spine nontender CV: S1/S2 noted, no murmurs/rubs/gallops noted LUNGS: Lungs are clear to auscultation bilaterally, no apparent distress ABDOMEN: soft, nontender, no rebound or guarding GU: no cva tenderness NEURO: Pt is awake/alert, moves all extremitiesx4 EXTREMITIES: pulses normal, full ROM. Trace of edema in bilateral feet. SKIN: warm, color normal PSYCH: no abnormalities of mood noted [facility] reading (PRIMARY) by  Dr. Joseph Art  CXR- normal.       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  Robyn Haber, MD

## 2014-07-20 NOTE — Patient Instructions (Addendum)
Health Maintenance Adopting a healthy lifestyle and getting preventive care can go a long way to promote health and wellness. Talk with your health care provider about what schedule of regular examinations is right for you. This is a good chance for you to check in with your provider about disease prevention and staying healthy. In between checkups, there are plenty of things you can do on your own. Experts have done a lot of research about which lifestyle changes and preventive measures are most likely to keep you healthy. Ask your health care provider for more information. WEIGHT AND DIET  Eat a healthy diet  Be sure to include plenty of vegetables, fruits, low-fat dairy products, and lean protein.  Do not eat a lot of foods high in solid fats, added sugars, or salt.  Get regular exercise. This is one of the most important things you can do for your health.  Most adults should exercise for at least 150 minutes each week. The exercise should increase your heart rate and make you sweat (moderate-intensity exercise).  Most adults should also do strengthening exercises at least twice a week. This is in addition to the moderate-intensity exercise.  Maintain a healthy weight  Body mass index (BMI) is a measurement that can be used to identify possible weight problems. It estimates body fat based on height and weight. Your health care provider can help determine your BMI and help you achieve or maintain a healthy weight.  For females 25 years of age and older:   A BMI below 18.5 is considered underweight.  A BMI of 18.5 to 24.9 is normal.  A BMI of 25 to 29.9 is considered overweight.  A BMI of 30 and above is considered obese.  Watch levels of cholesterol and blood lipids  You should start having your blood tested for lipids and cholesterol at 44 years of age, then have this test every 5 years.  You may need to have your cholesterol levels checked more often if:  Your lipid or  cholesterol levels are high.  You are older than 44 years of age.  You are at high risk for heart disease.  CANCER SCREENING   Lung Cancer  Lung cancer screening is recommended for adults 97-92 years old who are at high risk for lung cancer because of a history of smoking.  A yearly low-dose CT scan of the lungs is recommended for people who:  Currently smoke.  Have quit within the past 15 years.  Have at least a 30-pack-year history of smoking. A pack year is smoking an average of one pack of cigarettes a day for 1 year.  Yearly screening should continue until it has been 15 years since you quit.  Yearly screening should stop if you develop a health problem that would prevent you from having lung cancer treatment.  Breast Cancer  Practice breast self-awareness. This means understanding how your breasts normally appear and feel.  It also means doing regular breast self-exams. Let your health care provider know about any changes, no matter how small.  If you are in your 20s or 30s, you should have a clinical breast exam (CBE) by a health care provider every 1-3 years as part of a regular health exam.  If you are 76 or older, have a CBE every year. Also consider having a breast X-ray (mammogram) every year.  If you have a family history of breast cancer, talk to your health care provider about genetic screening.  If you are  at high risk for breast cancer, talk to your health care provider about having an MRI and a mammogram every year.  Breast cancer gene (BRCA) assessment is recommended for women who have family members with BRCA-related cancers. BRCA-related cancers include:  Breast.  Ovarian.  Tubal.  Peritoneal cancers.  Results of the assessment will determine the need for genetic counseling and BRCA1 and BRCA2 testing. Cervical Cancer Routine pelvic examinations to screen for cervical cancer are no longer recommended for nonpregnant women who are considered low  risk for cancer of the pelvic organs (ovaries, uterus, and vagina) and who do not have symptoms. A pelvic examination may be necessary if you have symptoms including those associated with pelvic infections. Ask your health care provider if a screening pelvic exam is right for you.   The Pap test is the screening test for cervical cancer for women who are considered at risk.  If you had a hysterectomy for a problem that was not cancer or a condition that could lead to cancer, then you no longer need Pap tests.  If you are older than 65 years, and you have had normal Pap tests for the past 10 years, you no longer need to have Pap tests.  If you have had past treatment for cervical cancer or a condition that could lead to cancer, you need Pap tests and screening for cancer for at least 20 years after your treatment.  If you no longer get a Pap test, assess your risk factors if they change (such as having a new sexual partner). This can affect whether you should start being screened again.  Some women have medical problems that increase their chance of getting cervical cancer. If this is the case for you, your health care provider may recommend more frequent screening and Pap tests.  The human papillomavirus (HPV) test is another test that may be used for cervical cancer screening. The HPV test looks for the virus that can cause cell changes in the cervix. The cells collected during the Pap test can be tested for HPV.  The HPV test can be used to screen women 30 years of age and older. Getting tested for HPV can extend the interval between normal Pap tests from three to five years.  An HPV test also should be used to screen women of any age who have unclear Pap test results.  After 44 years of age, women should have HPV testing as often as Pap tests.  Colorectal Cancer  This type of cancer can be detected and often prevented.  Routine colorectal cancer screening usually begins at 44 years of  age and continues through 44 years of age.  Your health care provider may recommend screening at an earlier age if you have risk factors for colon cancer.  Your health care provider may also recommend using home test kits to check for hidden blood in the stool.  A small camera at the end of a tube can be used to examine your colon directly (sigmoidoscopy or colonoscopy). This is done to check for the earliest forms of colorectal cancer.  Routine screening usually begins at age 50.  Direct examination of the colon should be repeated every 5-10 years through 44 years of age. However, you may need to be screened more often if early forms of precancerous polyps or small growths are found. Skin Cancer  Check your skin from head to toe regularly.  Tell your health care provider about any new moles or changes in   moles, especially if there is a change in a mole's shape or color.  Also tell your health care provider if you have a mole that is larger than the size of a pencil eraser.  Always use sunscreen. Apply sunscreen liberally and repeatedly throughout the day.  Protect yourself by wearing long sleeves, pants, a wide-brimmed hat, and sunglasses whenever you are outside. HEART DISEASE, DIABETES, AND HIGH BLOOD PRESSURE   Have your blood pressure checked at least every 1-2 years. High blood pressure causes heart disease and increases the risk of stroke.  If you are between 75 years and 42 years old, ask your health care provider if you should take aspirin to prevent strokes.  Have regular diabetes screenings. This involves taking a blood sample to check your fasting blood sugar level.  If you are at a normal weight and have a low risk for diabetes, have this test once every three years after 44 years of age.  If you are overweight and have a high risk for diabetes, consider being tested at a younger age or more often. PREVENTING INFECTION  Hepatitis B  If you have a higher risk for  hepatitis B, you should be screened for this virus. You are considered at high risk for hepatitis B if:  You were born in a country where hepatitis B is common. Ask your health care provider which countries are considered high risk.  Your parents were born in a high-risk country, and you have not been immunized against hepatitis B (hepatitis B vaccine).  You have HIV or AIDS.  You use needles to inject street drugs.  You live with someone who has hepatitis B.  You have had sex with someone who has hepatitis B.  You get hemodialysis treatment.  You take certain medicines for conditions, including cancer, organ transplantation, and autoimmune conditions. Hepatitis C  Blood testing is recommended for:  Everyone born from [birth year] through 1966.  Anyone with known risk factors for hepatitis C. Sexually transmitted infections (STIs)  You should be screened for sexually transmitted infections (STIs) including gonorrhea and chlamydia if:  You are sexually active and are younger than 44 years of age.  You are older than 44 years of age and your health care provider tells you that you are at risk for this type of infection.  Your sexual activity has changed since you were last screened and you are at an increased risk for chlamydia or gonorrhea. Ask your health care provider if you are at risk.  If you do not have HIV, but are at risk, it may be recommended that you take a prescription medicine daily to prevent HIV infection. This is called pre-exposure prophylaxis (PrEP). You are considered at risk if:  You are sexually active and do not regularly use condoms or know the HIV status of your partner(s).  You take drugs by injection.  You are sexually active with a partner who has HIV. Talk with your health care provider about whether you are at high risk of being infected with HIV. If you choose to begin PrEP, you should first be tested for HIV. You should then be tested every 3 months for  as long as you are taking PrEP.  PREGNANCY   If you are premenopausal and you may become pregnant, ask your health care provider about preconception counseling.  If you may become pregnant, take 400 to 800 micrograms (mcg) of folic acid every day.  If you want to prevent pregnancy, talk to your  health care provider about birth control (contraception). OSTEOPOROSIS AND MENOPAUSE   Osteoporosis is a disease in which the bones lose minerals and strength with aging. This can result in serious bone fractures. Your risk for osteoporosis can be identified using a bone density scan.  If you are 69 years of age or older, or if you are at risk for osteoporosis and fractures, ask your health care provider if you should be screened.  Ask your health care provider whether you should take a calcium or vitamin D supplement to lower your risk for osteoporosis.  Menopause may have certain physical symptoms and risks.  Hormone replacement therapy may reduce some of these symptoms and risks. Talk to your health care provider about whether hormone replacement therapy is right for you.  HOME CARE INSTRUCTIONS   Schedule regular health, dental, and eye exams.  Stay current with your immunizations.   Do not use any tobacco products including cigarettes, chewing tobacco, or electronic cigarettes.  If you are pregnant, do not drink alcohol.  If you are breastfeeding, limit how much and how often you drink alcohol.  Limit alcohol intake to no more than 1 drink per day for nonpregnant women. One drink equals 12 ounces of beer, 5 ounces of wine, or 1 ounces of hard liquor.  Do not use street drugs.  Do not share needles.  Ask your health care provider for help if you need support or information about quitting drugs.  Tell your health care provider if you often feel depressed.  Tell your health care provider if you have ever been abused or do not feel safe at home. Document Released: 03/09/2011  Document Revised: 01/08/2014 Document Reviewed: 07/26/2013 [facility] Patient Information 2015 [place], [place]. This information is not intended to replace advice given to you by your health care provider. Make sure you discuss any questions you have with your health care provider.      Subjective:    Patient ID: Kerri Sims, female    DOB: 1970/12/17, 43 y.o.   MRN: [id number] This chart was scribed for Robyn Haber, MD by Zola Button, Medical Scribe. This patient was seen in Room 13 and the patient's care was started at 4:50 PM.   HPI HPI Comments: Miyako Oelke is a 43 y.o. female who [facility] a [facility]. [facility]   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  Immunizations reviewed, up to date so far (requires next two hepatitis B and second varicella) Review of Systems  Constitutional: Negative for fever.  HENT: Negative for ear discharge.   Eyes: Negative for discharge.  Respiratory: Negative for cough.   Cardiovascular: Negative for chest pain.  Gastrointestinal: Negative for diarrhea.  Genitourinary: Negative for hematuria.  Musculoskeletal: Negative for back pain.  Skin: Negative for rash.  Neurological: Negative for seizures.  Psychiatric/Behavioral: Negative for hallucinations.       Objective:   Physical Exam CONSTITUTIONAL: Well developed/well nourished HEAD: Normocephalic/atraumatic EYES: EOM/PERRL ENMT: Mucous membranes moist NECK: supple no meningeal signs SPINE: entire spine nontender CV: S1/S2 noted, no murmurs/rubs/gallops noted LUNGS: Lungs are clear to auscultation bilaterally, no apparent distress ABDOMEN: soft, nontender, no rebound or guarding GU: no cva tenderness NEURO: Pt is awake/alert, moves all extremitiesx4 EXTREMITIES: pulses normal, full ROM. Trace of edema in bilateral feet. SKIN: warm, color  normal PSYCH: no abnormalities of mood noted [facility]  reading (PRIMARY) by  Dr. Joseph Art  CXR- normal.       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  Robyn Haber, MD

## 2014-09-03 ENCOUNTER — Encounter: Payer: Self-pay | Admitting: *Deleted

## 2017-07-19 ENCOUNTER — Ambulatory Visit (INDEPENDENT_AMBULATORY_CARE_PROVIDER_SITE_OTHER): Payer: Managed Care, Other (non HMO) | Admitting: Emergency Medicine

## 2017-07-19 ENCOUNTER — Other Ambulatory Visit: Payer: Self-pay

## 2017-07-19 ENCOUNTER — Encounter: Payer: Self-pay | Admitting: Emergency Medicine

## 2017-07-19 VITALS — BP 110/66 | HR 90 | Temp 98.9°F | Resp 16 | Ht 62.25 in | Wt 189.8 lb

## 2017-07-19 DIAGNOSIS — L989 Disorder of the skin and subcutaneous tissue, unspecified: Secondary | ICD-10-CM | POA: Diagnosis not present

## 2017-07-19 DIAGNOSIS — Z0000 Encounter for general adult medical examination without abnormal findings: Secondary | ICD-10-CM

## 2017-07-19 DIAGNOSIS — D259 Leiomyoma of uterus, unspecified: Secondary | ICD-10-CM

## 2017-07-19 DIAGNOSIS — Z1231 Encounter for screening mammogram for malignant neoplasm of breast: Secondary | ICD-10-CM | POA: Diagnosis not present

## 2017-07-19 MED ORDER — FUSION PLUS PO CAPS: 1.0000 | ORAL_CAPSULE | Freq: Every morning | ORAL | 5 refills | Status: DC

## 2017-07-19 NOTE — Progress Notes (Addendum)
Kerri Sims 46 y.o.   Chief Complaint  Patient presents with  . Annual Exam    no pap smear, just pelvic    HISTORY OF PRESENT ILLNESS: This is a 46 y.o. female Here for annual exam; no complaints and no medical concerns.   HPI   Prior to Admission medications   Medication Sig Start Date End Date Taking? Authorizing Provider  Iron-FA-B Cmp-C-Biot-Probiotic (FUSION PLUS) CAPS Take 1 capsule by mouth every morning. Patient not taking: Reported on 07/19/2017 02/08/13   Lahoma Crocker, MD    Allergies  Allergen Reactions  . Percocet [Oxycodone-Acetaminophen] Itching    There are no active problems to display for this patient.   Past Medical History:  Diagnosis Date  . Medical history non-contributory   . PONV (postoperative nausea and vomiting)     Past Surgical History:  Procedure Laterality Date  . CESAREAN SECTION      Social History   Socioeconomic History  . Marital status: Married    Spouse name: Not on file  . Number of children: Not on file  . Years of education: Not on file  . Highest education level: Not on file  Social Needs  . Financial resource strain: Not on file  . Food insecurity - worry: Not on file  . Food insecurity - inability: Not on file  . Transportation needs - medical: Not on file  . Transportation needs - non-medical: Not on file  Occupational History  . Not on file  Tobacco Use  . Smoking status: Never Smoker  . Smokeless tobacco: Never Used  Substance and Sexual Activity  . Alcohol use: No  . Drug use: No  . Sexual activity: Not on file  Other Topics Concern  . Not on file  Social History Narrative  . Not on file    Family History  Problem Relation Age of Onset  . Hyperlipidemia Mother   . Hypertension Mother   . Stroke Father      Review of Systems  Constitutional: Negative.  Negative for chills, fever and weight loss.  HENT: Negative.  Negative for congestion, hearing loss, nosebleeds and sore throat.    Eyes: Negative.  Negative for discharge and redness.  Respiratory: Negative for cough, hemoptysis, shortness of breath and wheezing.   Cardiovascular: Negative.  Negative for chest pain and palpitations.  Gastrointestinal: Negative.  Negative for abdominal pain, diarrhea, nausea and vomiting.  Genitourinary: Negative.  Negative for dysuria and hematuria.  Musculoskeletal: Negative for back pain, myalgias and neck pain.  Skin: Negative.   Neurological: Negative.  Negative for dizziness and headaches.  Endo/Heme/Allergies: Negative.   All other systems reviewed and are negative.  Vitals:   07/19/17 1440  BP: 110/66  Pulse: 90  Resp: 16  Temp: 98.9 F (37.2 C)  SpO2: 98%     Physical Exam  Constitutional: She is oriented to person, place, and time. She appears well-developed and well-nourished.  HENT:  Head: Normocephalic and atraumatic.  Right Ear: External ear normal.  Left Ear: External ear normal.  Nose: Nose normal.  Mouth/Throat: Oropharynx is clear and moist.  Eyes: Conjunctivae and EOM are normal. Pupils are equal, round, and reactive to light.  Neck: Normal range of motion. Neck supple. No JVD present. No thyromegaly present.  Cardiovascular: Regular rhythm, normal heart sounds and intact distal pulses.  Pulmonary/Chest: Effort normal and breath sounds normal. No respiratory distress.  Abdominal: Soft. Bowel sounds are normal. She exhibits no distension. There is no tenderness.  Genitourinary:  Vagina normal. No vaginal discharge found.  Musculoskeletal: Normal range of motion.  Lymphadenopathy:    She has no cervical adenopathy.  Neurological: She is alert and oriented to person, place, and time. She displays normal reflexes. No sensory deficit. She exhibits normal muscle tone.  Skin: Skin is warm and dry. Capillary refill takes less than 2 seconds.  Left forearm: dark slightly raised hard lesion  Psychiatric: She has a normal mood and affect. Her behavior is normal.   Vitals reviewed.    ASSESSMENT & PLAN:  Kerri Sims was seen today for annual exam.  Diagnoses and all orders for this visit:  Routine general medical examination at a health care facility -     CBC with Differential -     Comprehensive metabolic panel -     Hemoglobin A1c -     Lipid panel -     TSH -     HIV antibody  Leiomyoma of uterus, unspecified Comments: history of/ s/p hysterectomy Orders: -     Iron-FA-B Cmp-C-Biot-Probiotic (FUSION PLUS) CAPS; Take 1 capsule every morning by mouth.  Skin lesion of left arm -     Ambulatory referral to Dermatology    Patient Instructions       IF you received an x-ray today, you will receive an invoice from [facility]. Please contact [facility] at [phone] with questions or concerns regarding your invoice.   IF you received labwork today, you will receive an invoice from [facility]. Please contact [facility] at [phone] with questions or concerns regarding your invoice.   Our billing staff will not be able to assist you with questions regarding bills from these companies.  You will be contacted with the lab results as soon as they are available. The fastest way to get your results is to activate your My Chart account. Instructions are located on the last page of this paperwork. If you have not heard from [place] regarding the results in 2 weeks, please contact this office.     Health Maintenance, Female Adopting a healthy lifestyle and getting preventive care can go a long way to promote health and wellness. Talk with your health care provider about what schedule of regular examinations is right for you. This is a good chance for you to check in with your provider about disease prevention and staying healthy. In between checkups, there are plenty of things you can do on your own. Experts have done a lot of research about which lifestyle changes and preventive measures are most likely to keep you healthy. Ask  your health care provider for more information. Weight and diet Eat a healthy diet  Be sure to include plenty of vegetables, fruits, low-fat dairy products, and lean protein.  Do not eat a lot of foods high in solid fats, added sugars, or salt.  Get regular exercise. This is one of the most important things you can do for your health. ? Most adults should exercise for at least 150 minutes each week. The exercise should increase your heart rate and make you sweat (moderate-intensity exercise). ? Most adults should also do strengthening exercises at least twice a week. This is in addition to the moderate-intensity exercise.  Maintain a healthy weight  Body mass index (BMI) is a measurement that can be used to identify possible weight problems. It estimates body fat based on height and weight. Your health care provider can help determine your BMI and help you achieve or maintain a healthy weight.  For females  52 years of age and older: ? A BMI below 18.5 is considered underweight. ? A BMI of 18.5 to 24.9 is normal. ? A BMI of 25 to 29.9 is considered overweight. ? A BMI of 30 and above is considered obese.  Watch levels of cholesterol and blood lipids  You should start having your blood tested for lipids and cholesterol at 47 years of age, then have this test every 5 years.  You may need to have your cholesterol levels checked more often if: ? Your lipid or cholesterol levels are high. ? You are older than 47 years of age. ? You are at high risk for heart disease.  Cancer screening Lung Cancer  Lung cancer screening is recommended for adults 26-66 years old who are at high risk for lung cancer because of a history of smoking.  A yearly low-dose CT scan of the lungs is recommended for people who: ? Currently smoke. ? Have quit within the past 15 years. ? Have at least a 30-pack-year history of smoking. A pack year is smoking an average of one pack of cigarettes a day for 1  year.  Yearly screening should continue until it has been 15 years since you quit.  Yearly screening should stop if you develop a health problem that would prevent you from having lung cancer treatment.  Breast Cancer  Practice breast self-awareness. This means understanding how your breasts normally appear and feel.  It also means doing regular breast self-exams. Let your health care provider know about any changes, no matter how small.  If you are in your 20s or 30s, you should have a clinical breast exam (CBE) by a health care provider every 1-3 years as part of a regular health exam.  If you are 36 or older, have a CBE every year. Also consider having a breast X-ray (mammogram) every year.  If you have a family history of breast cancer, talk to your health care provider about genetic screening.  If you are at high risk for breast cancer, talk to your health care provider about having an MRI and a mammogram every year.  Breast cancer gene (BRCA) assessment is recommended for women who have family members with BRCA-related cancers. BRCA-related cancers include: ? Breast. ? Ovarian. ? Tubal. ? Peritoneal cancers.  Results of the assessment will determine the need for genetic counseling and BRCA1 and BRCA2 testing.  Cervical Cancer Your health care provider may recommend that you be screened regularly for cancer of the pelvic organs (ovaries, uterus, and vagina). This screening involves a pelvic examination, including checking for microscopic changes to the surface of your cervix (Pap test). You may be encouraged to have this screening done every 3 years, beginning at age 36.  For women ages 79-65, health care providers may recommend pelvic exams and Pap testing every 3 years, or they may recommend the Pap and pelvic exam, combined with testing for human papilloma virus (HPV), every 5 years. Some types of HPV increase your risk of cervical cancer. Testing for HPV may also be done on  women of any age with unclear Pap test results.  Other health care providers may not recommend any screening for nonpregnant women who are considered low risk for pelvic cancer and who do not have symptoms. Ask your health care provider if a screening pelvic exam is right for you.  If you have had past treatment for cervical cancer or a condition that could lead to cancer, you need Pap tests and  screening for cancer for at least 20 years after your treatment. If Pap tests have been discontinued, your risk factors (such as having a new sexual partner) need to be reassessed to determine if screening should resume. Some women have medical problems that increase the chance of getting cervical cancer. In these cases, your health care provider may recommend more frequent screening and Pap tests.  Colorectal Cancer  This type of cancer can be detected and often prevented.  Routine colorectal cancer screening usually begins at 47 years of age and continues through 47 years of age.  Your health care provider may recommend screening at an earlier age if you have risk factors for colon cancer.  Your health care provider may also recommend using home test kits to check for hidden blood in the stool.  A small camera at the end of a tube can be used to examine your colon directly (sigmoidoscopy or colonoscopy). This is done to check for the earliest forms of colorectal cancer.  Routine screening usually begins at age 78.  Direct examination of the colon should be repeated every 5-10 years through 47 years of age. However, you may need to be screened more often if early forms of precancerous polyps or small growths are found.  Skin Cancer  Check your skin from head to toe regularly.  Tell your health care provider about any new moles or changes in moles, especially if there is a change in a mole's shape or color.  Also tell your health care provider if you have a mole that is larger than the size of a  pencil eraser.  Always use sunscreen. Apply sunscreen liberally and repeatedly throughout the day.  Protect yourself by wearing long sleeves, pants, a wide-brimmed hat, and sunglasses whenever you are outside.  Heart disease, diabetes, and high blood pressure  High blood pressure causes heart disease and increases the risk of stroke. High blood pressure is more likely to develop in: ? People who have blood pressure in the high end of the normal range (130-139/85-89 mm Hg). ? People who are overweight or obese. ? People who are African American.  If you are 84-33 years of age, have your blood pressure checked every 3-5 years. If you are 40 years of age or older, have your blood pressure checked every year. You should have your blood pressure measured twice-once when you are at a hospital or clinic, and once when you are not at a hospital or clinic. Record the average of the two measurements. To check your blood pressure when you are not at a hospital or clinic, you can use: ? An automated blood pressure machine at a pharmacy. ? A home blood pressure monitor.  If you are between 45 years and 4 years old, ask your health care provider if you should take aspirin to prevent strokes.  Have regular diabetes screenings. This involves taking a blood sample to check your fasting blood sugar level. ? If you are at a normal weight and have a low risk for diabetes, have this test once every three years after 47 years of age. ? If you are overweight and have a high risk for diabetes, consider being tested at a younger age or more often. Preventing infection Hepatitis B  If you have a higher risk for hepatitis B, you should be screened for this virus. You are considered at high risk for hepatitis B if: ? You were born in a country where hepatitis B is common. Ask your  health care provider which countries are considered high risk. ? Your parents were born in a high-risk country, and you have not been  immunized against hepatitis B (hepatitis B vaccine). ? You have HIV or AIDS. ? You use needles to inject street drugs. ? You live with someone who has hepatitis B. ? You have had sex with someone who has hepatitis B. ? You get hemodialysis treatment. ? You take certain medicines for conditions, including cancer, organ transplantation, and autoimmune conditions.  Hepatitis C  Blood testing is recommended for: ? Everyone born from [birth year] through 1966. ? Anyone with known risk factors for hepatitis C.  Sexually transmitted infections (STIs)  You should be screened for sexually transmitted infections (STIs) including gonorrhea and chlamydia if: ? You are sexually active and are younger than 47 years of age. ? You are older than 47 years of age and your health care provider tells you that you are at risk for this type of infection. ? Your sexual activity has changed since you were last screened and you are at an increased risk for chlamydia or gonorrhea. Ask your health care provider if you are at risk.  If you do not have HIV, but are at risk, it may be recommended that you take a prescription medicine daily to prevent HIV infection. This is called pre-exposure prophylaxis (PrEP). You are considered at risk if: ? You are sexually active and do not regularly use condoms or know the HIV status of your partner(s). ? You take drugs by injection. ? You are sexually active with a partner who has HIV.  Talk with your health care provider about whether you are at high risk of being infected with HIV. If you choose to begin PrEP, you should first be tested for HIV. You should then be tested every 3 months for as long as you are taking PrEP. Pregnancy  If you are premenopausal and you may become pregnant, ask your health care provider about preconception counseling.  If you may become pregnant, take 400 to 800 micrograms (mcg) of folic acid every day.  If you want to prevent pregnancy, talk to your  health care provider about birth control (contraception). Osteoporosis and menopause  Osteoporosis is a disease in which the bones lose minerals and strength with aging. This can result in serious bone fractures. Your risk for osteoporosis can be identified using a bone density scan.  If you are 66 years of age or older, or if you are at risk for osteoporosis and fractures, ask your health care provider if you should be screened.  Ask your health care provider whether you should take a calcium or vitamin D supplement to lower your risk for osteoporosis.  Menopause may have certain physical symptoms and risks.  Hormone replacement therapy may reduce some of these symptoms and risks. Talk to your health care provider about whether hormone replacement therapy is right for you. Follow these instructions at home:  Schedule regular health, dental, and eye exams.  Stay current with your immunizations.  Do not use any tobacco products including cigarettes, chewing tobacco, or electronic cigarettes.  If you are pregnant, do not drink alcohol.  If you are breastfeeding, limit how much and how often you drink alcohol.  Limit alcohol intake to no more than 1 drink per day for nonpregnant women. One drink equals 12 ounces of beer, 5 ounces of wine, or 1 ounces of hard liquor.  Do not use street drugs.  Do not share needles.  Ask your health care provider for help if you need support or information about quitting drugs.  Tell your health care provider if you often feel depressed.  Tell your health care provider if you have ever been abused or do not feel safe at home. This information is not intended to replace advice given to you by your health care provider. Make sure you discuss any questions you have with your health care provider. Document Released: 03/09/2011 Document Revised: 01/30/2016 Document Reviewed: 05/28/2015 Elsevier Interactive Patient Education  2018 [place] (AHA) Exercise Recommendation  Being physically active is important to prevent heart disease and stroke, the nation's No. 1and No. 5killers. To improve overall cardiovascular health, we suggest at least 150 minutes per week of moderate exercise or 75 minutes per week of vigorous exercise (or a combination of moderate and vigorous activity). Thirty minutes a day, five times a week is an easy goal to remember. You will also experience benefits even if you divide your time into two or three segments of 10 to 15 minutes per day.  For people who would benefit from lowering their blood pressure or cholesterol, we recommend 40 minutes of aerobic exercise of moderate to vigorous intensity three to four times a week to lower the risk for heart attack and stroke.  Physical activity is anything that makes you move your body and burn calories.  This includes things like climbing stairs or playing sports. Aerobic exercises benefit your heart, and include walking, jogging, swimming or biking. Strength and stretching exercises are best for overall stamina and flexibility.  The simplest, positive change you can make to effectively improve your heart health is to start walking. It's enjoyable, free, easy, social and great exercise. A walking program is flexible and boasts high success rates because people can stick with it. It's easy for walking to become a regular and satisfying part of life.   For Overall Cardiovascular Health:  At least 30 minutes of moderate-intensity aerobic activity at least 5 days per week for a total of 150  OR   At least 25 minutes of vigorous aerobic activity at least 3 days per week for a total of 75 minutes; or a combination of moderate- and vigorous-intensity aerobic activity  AND   Moderate- to high-intensity muscle-strengthening activity at least 2 days per week for additional health benefits.  For Lowering Blood Pressure and  Cholesterol  An average 40 minutes of moderate- to vigorous-intensity aerobic activity 3 or 4 times per week  What if I can't make it to the time goal? Something is always better than nothing! And everyone has to start somewhere. Even if you've been sedentary for years, today is the day you can begin to make healthy changes in your life. If you don't think you'll make it for 30 or 40 minutes, set a reachable goal for today. You can work up toward your overall goal by increasing your time as you get stronger. Don't let all-or-nothing thinking rob you of doing what you can every day.  Source:[email or web address].[email or web address] Pop, MD [facility]

## 2017-07-19 NOTE — Addendum Note (Signed)
Addended by: Davina Poke on: 07/19/2017 04:02 PM   Modules accepted: Orders

## 2017-07-19 NOTE — Patient Instructions (Addendum)
   IF you received an x-ray today, you will receive an invoice from [facility]. Please contact  Radiology at [phone] with questions or concerns regarding your invoice.   IF you received labwork today, you will receive an invoice from [facility]. Please contact [facility] at [phone] with questions or concerns regarding your invoice.   Our billing staff will not be able to assist you with questions regarding bills from these companies.  You will be contacted with the lab results as soon as they are available. The fastest way to get your results is to activate your My Chart account. Instructions are located on the last page of this paperwork. If you have not heard from us regarding the results in 2 weeks, please contact this office.     Health Maintenance, Female Adopting a healthy lifestyle and getting preventive care can go a long way to promote health and wellness. Talk with your health care provider about what schedule of regular examinations is right for you. This is a good chance for you to check in with your provider about disease prevention and staying healthy. In between checkups, there are plenty of things you can do on your own. Experts have done a lot of research about which lifestyle changes and preventive measures are most likely to keep you healthy. Ask your health care provider for more information. Weight and diet Eat a healthy diet  Be sure to include plenty of vegetables, fruits, low-fat dairy products, and lean protein.  Do not eat a lot of foods high in solid fats, added sugars, or salt.  Get regular exercise. This is one of the most important things you can do for your health. ? Most adults should exercise for at least 150 minutes each week. The exercise should increase your heart rate and make you sweat (moderate-intensity exercise). ? Most adults should also do strengthening exercises at least twice a week. This is in addition to the  moderate-intensity exercise.  Maintain a healthy weight  Body mass index (BMI) is a measurement that can be used to identify possible weight problems. It estimates body fat based on height and weight. Your health care provider can help determine your BMI and help you achieve or maintain a healthy weight.  For females 20 years of age and older: ? A BMI below 18.5 is considered underweight. ? A BMI of 18.5 to 24.9 is normal. ? A BMI of 25 to 29.9 is considered overweight. ? A BMI of 30 and above is considered obese.  Watch levels of cholesterol and blood lipids  You should start having your blood tested for lipids and cholesterol at 47 years of age, then have this test every 5 years.  You may need to have your cholesterol levels checked more often if: ? Your lipid or cholesterol levels are high. ? You are older than 47 years of age. ? You are at high risk for heart disease.  Cancer screening Lung Cancer  Lung cancer screening is recommended for adults 55-80 years old who are at high risk for lung cancer because of a history of smoking.  A yearly low-dose CT scan of the lungs is recommended for people who: ? Currently smoke. ? Have quit within the past 15 years. ? Have at least a 30-pack-year history of smoking. A pack year is smoking an average of one pack of cigarettes a day for 1 year.  Yearly screening should continue until it has been 15 years since you quit.  Yearly   screening should stop if you develop a health problem that would prevent you from having lung cancer treatment.  Breast Cancer  Practice breast self-awareness. This means understanding how your breasts normally appear and feel.  It also means doing regular breast self-exams. Let your health care provider know about any changes, no matter how small.  If you are in your 20s or 30s, you should have a clinical breast exam (CBE) by a health care provider every 1-3 years as part of a regular health exam.  If you  are 27 or older, have a CBE every year. Also consider having a breast X-ray (mammogram) every year.  If you have a family history of breast cancer, talk to your health care provider about genetic screening.  If you are at high risk for breast cancer, talk to your health care provider about having an MRI and a mammogram every year.  Breast cancer gene (BRCA) assessment is recommended for women who have family members with BRCA-related cancers. BRCA-related cancers include: ? Breast. ? Ovarian. ? Tubal. ? Peritoneal cancers.  Results of the assessment will determine the need for genetic counseling and BRCA1 and BRCA2 testing.  Cervical Cancer Your health care provider may recommend that you be screened regularly for cancer of the pelvic organs (ovaries, uterus, and vagina). This screening involves a pelvic examination, including checking for microscopic changes to the surface of your cervix (Pap test). You may be encouraged to have this screening done every 3 years, beginning at age 97.  For women ages 90-65, health care providers may recommend pelvic exams and Pap testing every 3 years, or they may recommend the Pap and pelvic exam, combined with testing for human papilloma virus (HPV), every 5 years. Some types of HPV increase your risk of cervical cancer. Testing for HPV may also be done on women of any age with unclear Pap test results.  Other health care providers may not recommend any screening for nonpregnant women who are considered low risk for pelvic cancer and who do not have symptoms. Ask your health care provider if a screening pelvic exam is right for you.  If you have had past treatment for cervical cancer or a condition that could lead to cancer, you need Pap tests and screening for cancer for at least 20 years after your treatment. If Pap tests have been discontinued, your risk factors (such as having a new sexual partner) need to be reassessed to determine if screening should  resume. Some women have medical problems that increase the chance of getting cervical cancer. In these cases, your health care provider may recommend more frequent screening and Pap tests.  Colorectal Cancer  This type of cancer can be detected and often prevented.  Routine colorectal cancer screening usually begins at 47 years of age and continues through 47 years of age.  Your health care provider may recommend screening at an earlier age if you have risk factors for colon cancer.  Your health care provider may also recommend using home test kits to check for hidden blood in the stool.  A small camera at the end of a tube can be used to examine your colon directly (sigmoidoscopy or colonoscopy). This is done to check for the earliest forms of colorectal cancer.  Routine screening usually begins at age 70.  Direct examination of the colon should be repeated every 5-10 years through 47 years of age. However, you may need to be screened more often if early forms of precancerous polyps  or small growths are found.  Skin Cancer  Check your skin from head to toe regularly.  Tell your health care provider about any new moles or changes in moles, especially if there is a change in a mole's shape or color.  Also tell your health care provider if you have a mole that is larger than the size of a pencil eraser.  Always use sunscreen. Apply sunscreen liberally and repeatedly throughout the day.  Protect yourself by wearing long sleeves, pants, a wide-brimmed hat, and sunglasses whenever you are outside.  Heart disease, diabetes, and high blood pressure  High blood pressure causes heart disease and increases the risk of stroke. High blood pressure is more likely to develop in: ? People who have blood pressure in the high end of the normal range (130-139/85-89 mm Hg). ? People who are overweight or obese. ? People who are African American.  If you are 59-24 years of age, have your blood  pressure checked every 3-5 years. If you are 43 years of age or older, have your blood pressure checked every year. You should have your blood pressure measured twice-once when you are at a hospital or clinic, and once when you are not at a hospital or clinic. Record the average of the two measurements. To check your blood pressure when you are not at a hospital or clinic, you can use: ? An automated blood pressure machine at a pharmacy. ? A home blood pressure monitor.  If you are between 8 years and 68 years old, ask your health care provider if you should take aspirin to prevent strokes.  Have regular diabetes screenings. This involves taking a blood sample to check your fasting blood sugar level. ? If you are at a normal weight and have a low risk for diabetes, have this test once every three years after 47 years of age. ? If you are overweight and have a high risk for diabetes, consider being tested at a younger age or more often. Preventing infection Hepatitis B  If you have a higher risk for hepatitis B, you should be screened for this virus. You are considered at high risk for hepatitis B if: ? You were born in a country where hepatitis B is common. Ask your health care provider which countries are considered high risk. ? Your parents were born in a high-risk country, and you have not been immunized against hepatitis B (hepatitis B vaccine). ? You have HIV or AIDS. ? You use needles to inject street drugs. ? You live with someone who has hepatitis B. ? You have had sex with someone who has hepatitis B. ? You get hemodialysis treatment. ? You take certain medicines for conditions, including cancer, organ transplantation, and autoimmune conditions.  Hepatitis C  Blood testing is recommended for: ? Everyone born from [birth year] through 1966. ? Anyone with known risk factors for hepatitis C.  Sexually transmitted infections (STIs)  You should be screened for sexually transmitted  infections (STIs) including gonorrhea and chlamydia if: ? You are sexually active and are younger than 47 years of age. ? You are older than 47 years of age and your health care provider tells you that you are at risk for this type of infection. ? Your sexual activity has changed since you were last screened and you are at an increased risk for chlamydia or gonorrhea. Ask your health care provider if you are at risk.  If you do not have HIV, but are at risk,  it may be recommended that you take a prescription medicine daily to prevent HIV infection. This is called pre-exposure prophylaxis (PrEP). You are considered at risk if: ? You are sexually active and do not regularly use condoms or know the HIV status of your partner(s). ? You take drugs by injection. ? You are sexually active with a partner who has HIV.  Talk with your health care provider about whether you are at high risk of being infected with HIV. If you choose to begin PrEP, you should first be tested for HIV. You should then be tested every 3 months for as long as you are taking PrEP. Pregnancy  If you are premenopausal and you may become pregnant, ask your health care provider about preconception counseling.  If you may become pregnant, take 400 to 800 micrograms (mcg) of folic acid every day.  If you want to prevent pregnancy, talk to your health care provider about birth control (contraception). Osteoporosis and menopause  Osteoporosis is a disease in which the bones lose minerals and strength with aging. This can result in serious bone fractures. Your risk for osteoporosis can be identified using a bone density scan.  If you are 33 years of age or older, or if you are at risk for osteoporosis and fractures, ask your health care provider if you should be screened.  Ask your health care provider whether you should take a calcium or vitamin D supplement to lower your risk for osteoporosis.  Menopause may have certain physical  symptoms and risks.  Hormone replacement therapy may reduce some of these symptoms and risks. Talk to your health care provider about whether hormone replacement therapy is right for you. Follow these instructions at home:  Schedule regular health, dental, and eye exams.  Stay current with your immunizations.  Do not use any tobacco products including cigarettes, chewing tobacco, or electronic cigarettes.  If you are pregnant, do not drink alcohol.  If you are breastfeeding, limit how much and how often you drink alcohol.  Limit alcohol intake to no more than 1 drink per day for nonpregnant women. One drink equals 12 ounces of beer, 5 ounces of wine, or 1 ounces of hard liquor.  Do not use street drugs.  Do not share needles.  Ask your health care provider for help if you need support or information about quitting drugs.  Tell your health care provider if you often feel depressed.  Tell your health care provider if you have ever been abused or do not feel safe at home. This information is not intended to replace advice given to you by your health care provider. Make sure you discuss any questions you have with your health care provider. Document Released: 03/09/2011 Document Revised: 01/30/2016 Document Reviewed: 05/28/2015 Elsevier Interactive Patient Education  2018 [place] (AHA) Exercise Recommendation  Being physically active is important to prevent heart disease and stroke, the nation's No. 1and No. 5killers. To improve overall cardiovascular health, we suggest at least 150 minutes per week of moderate exercise or 75 minutes per week of vigorous exercise (or a combination of moderate and vigorous activity). Thirty minutes a day, five times a week is an easy goal to remember. You will also experience benefits even if you divide your time into two or three segments of 10 to 15 minutes per day.  For people who would benefit from lowering their  blood pressure or cholesterol, we recommend 40 minutes of aerobic exercise of moderate to  vigorous intensity three to four times a week to lower the risk for heart attack and stroke.  Physical activity is anything that makes you move your body and burn calories.  This includes things like climbing stairs or playing sports. Aerobic exercises benefit your heart, and include walking, jogging, swimming or biking. Strength and stretching exercises are best for overall stamina and flexibility.  The simplest, positive change you can make to effectively improve your heart health is to start walking. It's enjoyable, free, easy, social and great exercise. A walking program is flexible and boasts high success rates because people can stick with it. It's easy for walking to become a regular and satisfying part of life.   For Overall Cardiovascular Health:  At least 30 minutes of moderate-intensity aerobic activity at least 5 days per week for a total of 150  OR   At least 25 minutes of vigorous aerobic activity at least 3 days per week for a total of 75 minutes; or a combination of moderate- and vigorous-intensity aerobic activity  AND   Moderate- to high-intensity muscle-strengthening activity at least 2 days per week for additional health benefits.  For Lowering Blood Pressure and Cholesterol  An average 40 minutes of moderate- to vigorous-intensity aerobic activity 3 or 4 times per week  What if I can't make it to the time goal? Something is always better than nothing! And everyone has to start somewhere. Even if you've been sedentary for years, today is the day you can begin to make healthy changes in your life. If you don't think you'll make it for 30 or 40 minutes, set a reachable goal for today. You can work up toward your overall goal by increasing your time as you get stronger. Don't let all-or-nothing thinking rob you of doing what you can every day.  Source:http://www.heart.org

## 2017-07-19 NOTE — Addendum Note (Signed)
Addended by: Alfredia Ferguson A on: 07/19/2017 04:19 PM   Modules accepted: Orders

## 2017-07-20 ENCOUNTER — Telehealth: Payer: Self-pay

## 2017-07-20 ENCOUNTER — Other Ambulatory Visit: Payer: Self-pay | Admitting: Emergency Medicine

## 2017-07-20 ENCOUNTER — Encounter: Payer: Self-pay | Admitting: Emergency Medicine

## 2017-07-20 DIAGNOSIS — E119 Type 2 diabetes mellitus without complications: Secondary | ICD-10-CM

## 2017-07-20 LAB — CBC WITH DIFFERENTIAL/PLATELET
BASOS ABS: 0 10*3/uL (ref 0.0–0.2)
Basos: 1 %
EOS (ABSOLUTE): 0 10*3/uL (ref 0.0–0.4)
Eos: 0 %
HEMOGLOBIN: 12.7 g/dL (ref 11.1–15.9)
Hematocrit: 36.9 % (ref 34.0–46.6)
Immature Grans (Abs): 0 10*3/uL (ref 0.0–0.1)
Immature Granulocytes: 0 %
LYMPHS ABS: 2.5 10*3/uL (ref 0.7–3.1)
Lymphs: 55 %
MCH: 30.5 pg (ref 26.6–33.0)
MCHC: 34.4 g/dL (ref 31.5–35.7)
MCV: 89 fL (ref 79–97)
MONOCYTES: 11 %
Monocytes Absolute: 0.5 10*3/uL (ref 0.1–0.9)
NEUTROS ABS: 1.5 10*3/uL (ref 1.4–7.0)
Neutrophils: 33 %
Platelets: 344 10*3/uL (ref 150–379)
RBC: 4.17 x10E6/uL (ref 3.77–5.28)
RDW: 13.7 % (ref 12.3–15.4)
WBC: 4.6 10*3/uL (ref 3.4–10.8)

## 2017-07-20 LAB — COMPREHENSIVE METABOLIC PANEL
A/G RATIO: 1.5 (ref 1.2–2.2)
ALBUMIN: 4.4 g/dL (ref 3.5–5.5)
ALK PHOS: 60 IU/L (ref 39–117)
ALT: 9 IU/L (ref 0–32)
AST: 16 IU/L (ref 0–40)
BUN / CREAT RATIO: 9 (ref 9–23)
BUN: 6 mg/dL (ref 6–24)
Bilirubin Total: 0.3 mg/dL (ref 0.0–1.2)
CHLORIDE: 100 mmol/L (ref 96–106)
CO2: 22 mmol/L (ref 20–29)
Calcium: 9.5 mg/dL (ref 8.7–10.2)
Creatinine, Ser: 0.67 mg/dL (ref 0.57–1.00)
GFR calc Af Amer: 121 mL/min/{1.73_m2} (ref >59)
GFR calc non Af Amer: 105 mL/min/{1.73_m2} (ref >59)
GLOBULIN, TOTAL: 2.9 g/dL (ref 1.5–4.5)
GLUCOSE: 141 mg/dL — AB (ref 65–99)
POTASSIUM: 4.2 mmol/L (ref 3.5–5.2)
SODIUM: 139 mmol/L (ref 134–144)
Total Protein: 7.3 g/dL (ref 6.0–8.5)

## 2017-07-20 LAB — LIPID PANEL
CHOL/HDL RATIO: 4.1 ratio (ref 0.0–4.4)
Cholesterol, Total: 203 mg/dL — ABNORMAL HIGH (ref 100–199)
HDL: 49 mg/dL (ref >39)
LDL CALC: 133 mg/dL — AB (ref 0–99)
Triglycerides: 103 mg/dL (ref 0–149)
VLDL Cholesterol Cal: 21 mg/dL (ref 5–40)

## 2017-07-20 LAB — TSH: TSH: 1.29 u[IU]/mL (ref 0.450–4.500)

## 2017-07-20 LAB — HEMOGLOBIN A1C
Est. average glucose Bld gHb Est-mCnc: 212 mg/dL
HEMOGLOBIN A1C: 9 % — AB (ref 4.8–5.6)

## 2017-07-20 LAB — HIV ANTIBODY (ROUTINE TESTING W REFLEX): HIV Screen 4th Generation wRfx: NONREACTIVE

## 2017-07-20 MED ORDER — METFORMIN HCL 500 MG PO TABS: 500.0000 mg | ORAL_TABLET | Freq: Two times a day (BID) | ORAL | 3 refills | Status: DC

## 2017-07-20 NOTE — Telephone Encounter (Signed)
Pt. Called requesting MD to call in home glucose monitor and appropriate supplies to her pharmacy.

## 2017-07-20 NOTE — Telephone Encounter (Signed)
Pt. Called to report her pharmacy had Metformin for her and she doesn't take this medicine. Instructed to review her MyChart for doctor's e-mail and instructions on how to take the medication. Pt. Verbalizes understanding. Will call as needed.

## 2017-07-21 ENCOUNTER — Other Ambulatory Visit: Payer: Self-pay | Admitting: *Deleted

## 2017-07-21 ENCOUNTER — Other Ambulatory Visit (INDEPENDENT_AMBULATORY_CARE_PROVIDER_SITE_OTHER): Payer: Self-pay | Admitting: Emergency Medicine

## 2017-07-21 ENCOUNTER — Telehealth: Payer: Self-pay | Admitting: *Deleted

## 2017-07-21 ENCOUNTER — Other Ambulatory Visit: Payer: Self-pay | Admitting: Emergency Medicine

## 2017-07-21 ENCOUNTER — Telehealth: Payer: Self-pay | Admitting: Emergency Medicine

## 2017-07-21 DIAGNOSIS — R739 Hyperglycemia, unspecified: Secondary | ICD-10-CM

## 2017-07-21 MED ORDER — BLOOD GLUCOSE MONITOR KIT: PACK | 0 refills | Status: DC

## 2017-07-21 MED ORDER — BLOOD GLUCOSE MONITOR KIT
PACK | 0 refills | Status: DC
Start: 2017-07-21 — End: 2019-08-24

## 2017-07-21 NOTE — Telephone Encounter (Signed)
Pt  Called    Stating    Her prescription  For    The   Blood  Glucose  Meter  And  Supplies  Was  Not  Ready    Chart  Reviewed  And  Appears  To  Have  Been sent this  Am   To  [facility]  I  Tried  To  Call  Them but they  Are  Closed  For  Lunch    Pt  Notified

## 2017-07-21 NOTE — Telephone Encounter (Signed)
Pt is going out of town today around 230 pm and [facility] does not have glucometer and supplies. Please call into pharm

## 2017-07-21 NOTE — Telephone Encounter (Signed)
Faxed Rx for glucose meter kit and supplies to [facility]. Confirmation page received 6:00 PM.

## 2017-10-21 ENCOUNTER — Ambulatory Visit: Payer: Self-pay

## 2017-12-10 ENCOUNTER — Encounter: Payer: Self-pay | Admitting: Emergency Medicine

## 2017-12-10 ENCOUNTER — Ambulatory Visit: Payer: BLUE CROSS/BLUE SHIELD | Admitting: Emergency Medicine

## 2017-12-10 VITALS — BP 108/75 | HR 79 | Temp 98.6°F | Resp 17 | Ht 62.0 in | Wt 177.0 lb

## 2017-12-10 DIAGNOSIS — E785 Hyperlipidemia, unspecified: Secondary | ICD-10-CM | POA: Insufficient documentation

## 2017-12-10 DIAGNOSIS — E1169 Type 2 diabetes mellitus with other specified complication: Secondary | ICD-10-CM | POA: Insufficient documentation

## 2017-12-10 DIAGNOSIS — E119 Type 2 diabetes mellitus without complications: Secondary | ICD-10-CM

## 2017-12-10 LAB — GLUCOSE, POCT (MANUAL RESULT ENTRY): POC Glucose: 98 mg/dl (ref 70–99)

## 2017-12-10 LAB — POCT GLYCOSYLATED HEMOGLOBIN (HGB A1C): Hemoglobin A1C: 6.4

## 2017-12-10 NOTE — Assessment & Plan Note (Addendum)
Much improved.  Hemoglobin A1c down to 6.4.  Previous one was 9.0.  Losing weight and staying very active.  We will continue present treatment.

## 2017-12-10 NOTE — Progress Notes (Signed)
Kerri Sims 47 y.o.   Chief Complaint  Patient presents with  . Follow-up    diabetes     HISTORY OF PRESENT ILLNESS: This is a 47 y.o. female here for follow-up of diabetes type 2.  Started taking metformin last November when I saw her 07/19/2017. Hemoglobin A1c was elevated at 9.0.  Also lipid profile came back a little bit elevated.  It was not a fasting sample.  She is fasting today.  Will repeat.  Asymptomatic.  Eating well.  Has lost weight.  Has no complaints.  She is a Marine scientist and works night shifts.  Lab Results  Component Value Date   HGBA1C 9.0 (H) 07/19/2017   Wt Readings from Last 3 Encounters:  12/10/17 177 lb (80.3 kg)  07/19/17 189 lb 12.8 oz (86.1 kg)  07/20/14 183 lb (83 kg)     HPI   Prior to Admission medications   Medication Sig Start Date End Date Taking? Authorizing Provider  blood glucose meter kit and supplies KIT Dispense based on patient and insurance preference. Use up to four times daily as directed. (FOR ICD-9 250.00, 250.01). 07/21/17  Yes Colt Martelle, Ines Bloomer, MD  Iron-FA-B Cmp-C-Biot-Probiotic (FUSION PLUS) CAPS Take 1 capsule every morning by mouth. 07/19/17  Yes Hasana Alcorta, Ines Bloomer, MD  metFORMIN (GLUCOPHAGE) 500 MG tablet Take 1 tablet (500 mg total) 2 (two) times daily with a meal by mouth. 07/20/17  Yes Alma, Ines Bloomer, MD    Allergies  Allergen Reactions  . Percocet [Oxycodone-Acetaminophen] Itching    There are no active problems to display for this patient.   Past Medical History:  Diagnosis Date  . Medical history non-contributory   . PONV (postoperative nausea and vomiting)     Past Surgical History:  Procedure Laterality Date  . ABDOMINAL HYSTERECTOMY N/A 03/31/2013   Procedure: HYSTERECTOMY ABDOMINAL;  Surgeon: Lahoma Crocker, MD;  Location: [facility];  Service: Gynecology;  Laterality: N/A;  fibroids  . BILATERAL SALPINGECTOMY Bilateral 03/31/2013   Procedure: BILATERAL SALPINGECTOMY;  Surgeon: Lahoma Crocker, MD;  Location: [facility];  Service: Gynecology;  Laterality: Bilateral;  . CESAREAN SECTION      Social History   Socioeconomic History  . Marital status: Married    Spouse name: Not on file  . Number of children: Not on file  . Years of education: Not on file  . Highest education level: Not on file  Occupational History  . Not on file  Social Needs  . Financial resource strain: Not on file  . Food insecurity:    Worry: Not on file    Inability: Not on file  . Transportation needs:    Medical: Not on file    Non-medical: Not on file  Tobacco Use  . Smoking status: Never Smoker  . Smokeless tobacco: Never Used  Substance and Sexual Activity  . Alcohol use: No  . Drug use: No  . Sexual activity: Not on file  Lifestyle  . Physical activity:    Days per week: Not on file    Minutes per session: Not on file  . Stress: Not on file  Relationships  . Social connections:    Talks on phone: Not on file    Gets together: Not on file    Attends religious service: Not on file    Active member of club or organization: Not on file    Attends meetings of clubs or organizations: Not on file    Relationship status: Not on file  .  Intimate partner violence:    Fear of current or ex partner: Not on file    Emotionally abused: Not on file    Physically abused: Not on file    Forced sexual activity: Not on file  Other Topics Concern  . Not on file  Social History Narrative  . Not on file    Family History  Problem Relation Age of Onset  . Hyperlipidemia Mother   . Hypertension Mother   . Stroke Father      Review of Systems  Constitutional: Positive for weight loss (intentional). Negative for chills and fever.  HENT: Negative.  Negative for sore throat.   Eyes: Negative for blurred vision and double vision.  Respiratory: Negative.  Negative for cough and shortness of breath.   Cardiovascular: Negative.  Negative for chest pain, palpitations and leg swelling.    Gastrointestinal: Negative.  Negative for abdominal pain, blood in stool, diarrhea, melena, nausea and vomiting.  Genitourinary: Negative.  Negative for dysuria and hematuria.  Musculoskeletal: Negative.  Negative for back pain, myalgias and neck pain.  Skin: Negative.  Negative for rash.  Neurological: Negative.  Negative for dizziness, sensory change, focal weakness, weakness and headaches.  Endo/Heme/Allergies: Negative.   All other systems reviewed and are negative.   Vitals:   12/10/17 1129  BP: 108/75  Pulse: 79  Resp: 17  Temp: 98.6 F (37 C)  SpO2: 98%    Physical Exam  Constitutional: She is oriented to person, place, and time. She appears well-developed and well-nourished.  HENT:  Head: Normocephalic and atraumatic.  Right Ear: External ear normal.  Left Ear: External ear normal.  Nose: Nose normal.  Mouth/Throat: Oropharynx is clear and moist.  Eyes: Pupils are equal, round, and reactive to light. Conjunctivae and EOM are normal.  Neck: Normal range of motion. Neck supple. No JVD present. No thyromegaly present.  Cardiovascular: Normal rate, regular rhythm and normal heart sounds.  Pulmonary/Chest: Effort normal and breath sounds normal.  Abdominal: Soft. Bowel sounds are normal. She exhibits no distension. There is no tenderness.  Musculoskeletal: Normal range of motion. She exhibits no edema.  Lymphadenopathy:    She has no cervical adenopathy.  Neurological: She is alert and oriented to person, place, and time. No sensory deficit. She exhibits normal muscle tone.  Skin: Skin is warm and dry. Capillary refill takes less than 2 seconds. No rash noted.  Psychiatric: She has a normal mood and affect. Her behavior is normal.  Vitals reviewed.  Results for orders placed or performed in visit on 12/10/17 (from the past 24 hour(s))  POCT glucose (manual entry)     Status: None   Collection Time: 12/10/17 12:23 PM  Result Value Ref Range   POC Glucose 98 70 - 99  mg/dl  POCT glycosylated hemoglobin (Hb A1C)     Status: None   Collection Time: 12/10/17 12:24 PM  Result Value Ref Range   Hemoglobin A1C 6.4    Type 2 diabetes mellitus without complication, without long-term current use of insulin (HCC) Much improved.  Hemoglobin A1c down to 6.4.  Previous one was 9.0.  Losing weight and staying very active.  We will continue present treatment.  Follow-up in 6 months.  ASSESSMENT & PLAN: Rosell was seen today for follow-up.  Diagnoses and all orders for this visit:  Type 2 diabetes mellitus without complication, without long-term current use of insulin (HCC) -     POCT glucose (manual entry) -     POCT glycosylated  hemoglobin (Hb A1C) -     Lipid panel  A total of 25 minutes was spent in the room with the patient, greater than 50% of which was in counseling/coordination of care.   Patient Instructions       IF you received an x-ray today, you will receive an invoice from [facility]. Please contact [facility] at [phone] with questions or concerns regarding your invoice.   IF you received labwork today, you will receive an invoice from [facility]. Please contact [facility] at [phone] with questions or concerns regarding your invoice.   Our billing staff will not be able to assist you with questions regarding bills from these companies.  You will be contacted with the lab results as soon as they are available. The fastest way to get your results is to activate your My Chart account. Instructions are located on the last page of this paperwork. If you have not heard from [place] regarding the results in 2 weeks, please contact this office.      Diabetes Mellitus and Nutrition When you have diabetes (diabetes mellitus), it is very important to have healthy eating habits because your blood sugar (glucose) levels are greatly affected by what you eat and drink. Eating healthy foods in the appropriate amounts, at about the  same times every day, can help you:  Control your blood glucose.  Lower your risk of heart disease.  Improve your blood pressure.  Reach or maintain a healthy weight.  Every person with diabetes is different, and each person has different needs for a meal plan. Your health care provider may recommend that you work with a diet and nutrition specialist (dietitian) to make a meal plan that is best for you. Your meal plan may vary depending on factors such as:  The calories you need.  The medicines you take.  Your weight.  Your blood glucose, blood pressure, and cholesterol levels.  Your activity level.  Other health conditions you have, such as heart or kidney disease.  How do carbohydrates affect me? Carbohydrates affect your blood glucose level more than any other type of food. Eating carbohydrates naturally increases the amount of glucose in your blood. Carbohydrate counting is a method for keeping track of how many carbohydrates you eat. Counting carbohydrates is important to keep your blood glucose at a healthy level, especially if you use insulin or take certain oral diabetes medicines. It is important to know how many carbohydrates you can safely have in each meal. This is different for every person. Your dietitian can help you calculate how many carbohydrates you should have at each meal and for snack. Foods that contain carbohydrates include:  Bread, cereal, rice, pasta, and crackers.  Potatoes and corn.  Peas, beans, and lentils.  Milk and yogurt.  Fruit and juice.  Desserts, such as cakes, cookies, ice cream, and candy.  How does alcohol affect me? Alcohol can cause a sudden decrease in blood glucose (hypoglycemia), especially if you use insulin or take certain oral diabetes medicines. Hypoglycemia can be a life-threatening condition. Symptoms of hypoglycemia (sleepiness, dizziness, and confusion) are similar to symptoms of having too much alcohol. If your health  care provider says that alcohol is safe for you, follow these guidelines:  Limit alcohol intake to no more than 1 drink per day for nonpregnant women and 2 drinks per day for men. One drink equals 12 oz of beer, 5 oz of wine, or 1 oz of hard liquor.  Do not drink on an  empty stomach.  Keep yourself hydrated with water, diet soda, or unsweetened iced tea.  Keep in mind that regular soda, juice, and other mixers may contain a lot of sugar and must be counted as carbohydrates.  What are tips for following this plan? Reading food labels  Start by checking the serving size on the label. The amount of calories, carbohydrates, fats, and other nutrients listed on the label are based on one serving of the food. Many foods contain more than one serving per package.  Check the total grams (g) of carbohydrates in one serving. You can calculate the number of servings of carbohydrates in one serving by dividing the total carbohydrates by 15. For example, if a food has 30 g of total carbohydrates, it would be equal to 2 servings of carbohydrates.  Check the number of grams (g) of saturated and trans fats in one serving. Choose foods that have low or no amount of these fats.  Check the number of milligrams (mg) of sodium in one serving. Most people should limit total sodium intake to less than 2,300 mg per day.  Always check the nutrition information of foods labeled as "low-fat" or "nonfat". These foods may be higher in added sugar or refined carbohydrates and should be avoided.  Talk to your dietitian to identify your daily goals for nutrients listed on the label. Shopping  Avoid buying canned, premade, or processed foods. These foods tend to be high in fat, sodium, and added sugar.  Shop around the outside edge of the grocery store. This includes fresh fruits and vegetables, bulk grains, fresh meats, and fresh dairy. Cooking  Use low-heat cooking methods, such as baking, instead of high-heat  cooking methods like deep frying.  Cook using healthy oils, such as olive, canola, or sunflower oil.  Avoid cooking with butter, cream, or high-fat meats. Meal planning  Eat meals and snacks regularly, preferably at the same times every day. Avoid going long periods of time without eating.  Eat foods high in fiber, such as fresh fruits, vegetables, beans, and whole grains. Talk to your dietitian about how many servings of carbohydrates you can eat at each meal.  Eat 4-6 ounces of lean protein each day, such as lean meat, chicken, fish, eggs, or tofu. 1 ounce is equal to 1 ounce of meat, chicken, or fish, 1 egg, or 1/4 cup of tofu.  Eat some foods each day that contain healthy fats, such as avocado, nuts, seeds, and fish. Lifestyle   Check your blood glucose regularly.  Exercise at least 30 minutes 5 or more days each week, or as told by your health care provider.  Take medicines as told by your health care provider.  Do not use any products that contain nicotine or tobacco, such as cigarettes and e-cigarettes. If you need help quitting, ask your health care provider.  Work with a Social worker or diabetes educator to identify strategies to manage stress and any emotional and social challenges. What are some questions to ask my health care provider?  Do I need to meet with a diabetes educator?  Do I need to meet with a dietitian?  What number can I call if I have questions?  When are the best times to check my blood glucose? Where to find more information:  American Diabetes Association: diabetes.org/food-and-fitness/food  Academy of Nutrition and Dietetics: PokerClues.dk  [employer] of Diabetes and Digestive and Kidney Diseases (NIH): ContactWire.be Summary  A healthy meal plan will help you control your blood  glucose and maintain a healthy  lifestyle.  Working with a diet and nutrition specialist (dietitian) can help you make a meal plan that is best for you.  Keep in mind that carbohydrates and alcohol have immediate effects on your blood glucose levels. It is important to count carbohydrates and to use alcohol carefully. This information is not intended to replace advice given to you by your health care provider. Make sure you discuss any questions you have with your health care provider. Document Released: 05/21/2005 Document Revised: 09/28/2016 Document Reviewed: 09/28/2016 Elsevier Interactive Patient Education  2018 [employer].      Agustina Caroli, MD [facility]

## 2017-12-10 NOTE — Patient Instructions (Addendum)
   IF you received an x-ray today, you will receive an invoice from [facility]. Please contact [facility] at [phone] with questions or concerns regarding your invoice.   IF you received labwork today, you will receive an invoice from [facility]. Please contact [facility] at [phone] with questions or concerns regarding your invoice.   Our billing staff will not be able to assist you with questions regarding bills from these companies.  You will be contacted with the lab results as soon as they are available. The fastest way to get your results is to activate your My Chart account. Instructions are located on the last page of this paperwork. If you have not heard from us regarding the results in 2 weeks, please contact this office.     Diabetes Mellitus and Nutrition When you have diabetes (diabetes mellitus), it is very important to have healthy eating habits because your blood sugar (glucose) levels are greatly affected by what you eat and drink. Eating healthy foods in the appropriate amounts, at about the same times every day, can help you:  Control your blood glucose.  Lower your risk of heart disease.  Improve your blood pressure.  Reach or maintain a healthy weight.  Every person with diabetes is different, and each person has different needs for a meal plan. Your health care provider may recommend that you work with a diet and nutrition specialist (dietitian) to make a meal plan that is best for you. Your meal plan may vary depending on factors such as:  The calories you need.  The medicines you take.  Your weight.  Your blood glucose, blood pressure, and cholesterol levels.  Your activity level.  Other health conditions you have, such as heart or kidney disease.  How do carbohydrates affect me? Carbohydrates affect your blood glucose level more than any other type of food. Eating carbohydrates naturally increases the amount of glucose in your  blood. Carbohydrate counting is a method for keeping track of how many carbohydrates you eat. Counting carbohydrates is important to keep your blood glucose at a healthy level, especially if you use insulin or take certain oral diabetes medicines. It is important to know how many carbohydrates you can safely have in each meal. This is different for every person. Your dietitian can help you calculate how many carbohydrates you should have at each meal and for snack. Foods that contain carbohydrates include:  Bread, cereal, rice, pasta, and crackers.  Potatoes and corn.  Peas, beans, and lentils.  Milk and yogurt.  Fruit and juice.  Desserts, such as cakes, cookies, ice cream, and candy.  How does alcohol affect me? Alcohol can cause a sudden decrease in blood glucose (hypoglycemia), especially if you use insulin or take certain oral diabetes medicines. Hypoglycemia can be a life-threatening condition. Symptoms of hypoglycemia (sleepiness, dizziness, and confusion) are similar to symptoms of having too much alcohol. If your health care provider says that alcohol is safe for you, follow these guidelines:  Limit alcohol intake to no more than 1 drink per day for nonpregnant women and 2 drinks per day for men. One drink equals 12 oz of beer, 5 oz of wine, or 1 oz of hard liquor.  Do not drink on an empty stomach.  Keep yourself hydrated with water, diet soda, or unsweetened iced tea.  Keep in mind that regular soda, juice, and other mixers may contain a lot of sugar and must be counted as carbohydrates.  What are tips for following   this plan? Reading food labels  Start by checking the serving size on the label. The amount of calories, carbohydrates, fats, and other nutrients listed on the label are based on one serving of the food. Many foods contain more than one serving per package.  Check the total grams (g) of carbohydrates in one serving. You can calculate the number of servings of  carbohydrates in one serving by dividing the total carbohydrates by 15. For example, if a food has 30 g of total carbohydrates, it would be equal to 2 servings of carbohydrates.  Check the number of grams (g) of saturated and trans fats in one serving. Choose foods that have low or no amount of these fats.  Check the number of milligrams (mg) of sodium in one serving. Most people should limit total sodium intake to less than 2,300 mg per day.  Always check the nutrition information of foods labeled as "low-fat" or "nonfat". These foods may be higher in added sugar or refined carbohydrates and should be avoided.  Talk to your dietitian to identify your daily goals for nutrients listed on the label. Shopping  Avoid buying canned, premade, or processed foods. These foods tend to be high in fat, sodium, and added sugar.  Shop around the outside edge of the grocery store. This includes fresh fruits and vegetables, bulk grains, fresh meats, and fresh dairy. Cooking  Use low-heat cooking methods, such as baking, instead of high-heat cooking methods like deep frying.  Cook using healthy oils, such as olive, canola, or sunflower oil.  Avoid cooking with butter, cream, or high-fat meats. Meal planning  Eat meals and snacks regularly, preferably at the same times every day. Avoid going long periods of time without eating.  Eat foods high in fiber, such as fresh fruits, vegetables, beans, and whole grains. Talk to your dietitian about how many servings of carbohydrates you can eat at each meal.  Eat 4-6 ounces of lean protein each day, such as lean meat, chicken, fish, eggs, or tofu. 1 ounce is equal to 1 ounce of meat, chicken, or fish, 1 egg, or 1/4 cup of tofu.  Eat some foods each day that contain healthy fats, such as avocado, nuts, seeds, and fish. Lifestyle   Check your blood glucose regularly.  Exercise at least 30 minutes 5 or more days each week, or as told by your health care  provider.  Take medicines as told by your health care provider.  Do not use any products that contain nicotine or tobacco, such as cigarettes and e-cigarettes. If you need help quitting, ask your health care provider.  Work with a counselor or diabetes educator to identify strategies to manage stress and any emotional and social challenges. What are some questions to ask my health care provider?  Do I need to meet with a diabetes educator?  Do I need to meet with a dietitian?  What number can I call if I have questions?  When are the best times to check my blood glucose? Where to find more information:  American Diabetes Association: diabetes.org/food-and-fitness/food  Academy of Nutrition and Dietetics: www.eatright.org/resources/health/diseases-and-conditions/diabetes  National Institute of Diabetes and Digestive and Kidney Diseases (NIH): www.niddk.nih.gov/health-information/diabetes/overview/diet-eating-physical-activity Summary  A healthy meal plan will help you control your blood glucose and maintain a healthy lifestyle.  Working with a diet and nutrition specialist (dietitian) can help you make a meal plan that is best for you.  Keep in mind that carbohydrates and alcohol have immediate effects on your blood glucose   glucose levels. It is important to count carbohydrates and to use alcohol carefully. This information is not intended to replace advice given to you by your health care provider. Make sure you discuss any questions you have with your health care provider. Document Released: 05/21/2005 Document Revised: 09/28/2016 Document Reviewed: 09/28/2016 Elsevier Interactive Patient Education  [employer].

## 2017-12-11 LAB — LIPID PANEL
CHOLESTEROL TOTAL: 210 mg/dL — AB (ref 100–199)
Chol/HDL Ratio: 3.1 ratio (ref 0.0–4.4)
HDL: 68 mg/dL (ref >39)
LDL CALC: 124 mg/dL — AB (ref 0–99)
Triglycerides: 91 mg/dL (ref 0–149)
VLDL CHOLESTEROL CAL: 18 mg/dL (ref 5–40)

## 2017-12-13 ENCOUNTER — Encounter: Payer: Self-pay | Admitting: *Deleted

## 2017-12-14 ENCOUNTER — Ambulatory Visit: Payer: Self-pay

## 2018-04-20 DIAGNOSIS — E119 Type 2 diabetes mellitus without complications: Secondary | ICD-10-CM | POA: Diagnosis not present

## 2018-07-21 ENCOUNTER — Other Ambulatory Visit: Payer: Self-pay | Admitting: Emergency Medicine

## 2018-07-21 DIAGNOSIS — E119 Type 2 diabetes mellitus without complications: Secondary | ICD-10-CM

## 2018-07-21 NOTE — Telephone Encounter (Signed)
30 courtesy refill. TC to patient-scheduled 6 month OV for 07/27/18. Requested Prescriptions  Pending Prescriptions Disp Refills  . CONTOUR NEXT TEST test strip [Pharmacy Med Name: CONTOUR NEXT        TES]  1    Sig: CHECK GLUCOSE 4 TIMES DAILY     Endocrinology: Diabetes - Testing Supplies Passed - 07/21/2018  2:38 PM      Passed - Valid encounter within last 12 months    Recent Outpatient Visits          7 months ago Type 2 diabetes mellitus without complication, without long-term current use of insulin [facility])   Primary Care at [facility], Ines Bloomer, MD   1 year ago Routine general medical examination at a health care facility   [facility] [place], [place], [place]   4 years ago Annual physical exam   [facility] at [facility], Synetta Shadow, MD   4 years ago Need for MMR vaccine   Primary Care at [facility], [place], PA-C   4 years ago Immunization due   Primary Care at [facility], Gay Filler, MD      Future Appointments            In 6 days [place], Ines Bloomer, MD Primary Care at [place], [facility]         . metFORMIN ([place]) 500 MG tablet [Pharmacy Med Name: METFORMIN 500MG TAB] 60 tablet 0    Sig: TAKE 1 [place]     Endocrinology:  Diabetes - Biguanides Failed - 07/21/2018  2:38 PM      Failed - Cr in normal range and within 360 days    Creatinine, Ser  Date Value Ref Range Status  07/19/2017 0.67 0.57 - 1.00 mg/dL Final         Failed - HBA1C is between 0 and 7.9 and within 180 days    Hemoglobin A1C  Date Value Ref Range Status  12/10/2017 6.4  Final   Hgb A1c MFr Bld  Date Value Ref Range Status  07/19/2017 9.0 (H) 4.8 - 5.6 % Final    Comment:             Prediabetes: 5.7 - 6.4          Diabetes: >6.4          Glycemic control for adults with diabetes: <7.0          Failed - eGFR in normal range and within 360 days    GFR calc Af Amer  Date Value Ref Range Status  07/19/2017 121 >59  mL/min/1.73 Final   GFR calc non Af Amer  Date Value Ref Range Status  07/19/2017 105 >59 mL/min/1.73 Final         Failed - Valid encounter within last 6 months    Recent Outpatient Visits          7 months ago Type 2 diabetes mellitus without complication, without long-term current use of insulin [facility])   Primary Care at [facility], Ines Bloomer, MD   1 year ago Routine general medical examination at a health care facility   Primary Care at [place], Ines Bloomer, MD   4 years ago Annual physical exam   Primary Care at [facility], MD   4 years ago Need for MMR vaccine   Primary Care at [facility], [place], PA-C   4 years ago Immunization due   Primary  Care at [facility], Gay Filler, MD      Future Appointments            In 6 days [place], Ines Bloomer, MD Primary Care at [place], [facility]

## 2018-07-27 ENCOUNTER — Other Ambulatory Visit: Payer: Self-pay

## 2018-07-27 ENCOUNTER — Encounter: Payer: Self-pay | Admitting: Emergency Medicine

## 2018-07-27 ENCOUNTER — Ambulatory Visit (INDEPENDENT_AMBULATORY_CARE_PROVIDER_SITE_OTHER): Payer: BLUE CROSS/BLUE SHIELD | Admitting: Emergency Medicine

## 2018-07-27 VITALS — BP 106/72 | HR 75 | Temp 98.8°F | Resp 16 | Ht 62.75 in | Wt 179.4 lb

## 2018-07-27 DIAGNOSIS — E119 Type 2 diabetes mellitus without complications: Secondary | ICD-10-CM | POA: Diagnosis not present

## 2018-07-27 DIAGNOSIS — E785 Hyperlipidemia, unspecified: Secondary | ICD-10-CM | POA: Diagnosis not present

## 2018-07-27 LAB — GLUCOSE, POCT (MANUAL RESULT ENTRY): POC GLUCOSE: 105 mg/dL — AB (ref 70–99)

## 2018-07-27 LAB — LIPID PANEL
CHOL/HDL RATIO: 3.4 ratio (ref 0.0–4.4)
Cholesterol, Total: 213 mg/dL — ABNORMAL HIGH (ref 100–199)
HDL: 63 mg/dL (ref >39)
LDL CALC: 131 mg/dL — AB (ref 0–99)
Triglycerides: 96 mg/dL (ref 0–149)
VLDL CHOLESTEROL CAL: 19 mg/dL (ref 5–40)

## 2018-07-27 LAB — COMPREHENSIVE METABOLIC PANEL
ALBUMIN: 4.3 g/dL (ref 3.5–5.5)
ALT: 8 IU/L (ref 0–32)
AST: 14 IU/L (ref 0–40)
Albumin/Globulin Ratio: 1.5 (ref 1.2–2.2)
Alkaline Phosphatase: 50 IU/L (ref 39–117)
BILIRUBIN TOTAL: 0.3 mg/dL (ref 0.0–1.2)
BUN / CREAT RATIO: 12 (ref 9–23)
BUN: 9 mg/dL (ref 6–24)
CHLORIDE: 101 mmol/L (ref 96–106)
CO2: 23 mmol/L (ref 20–29)
Calcium: 9.8 mg/dL (ref 8.7–10.2)
Creatinine, Ser: 0.77 mg/dL (ref 0.57–1.00)
GFR calc non Af Amer: 92 mL/min/{1.73_m2} (ref >59)
GFR, EST AFRICAN AMERICAN: 106 mL/min/{1.73_m2} (ref >59)
Globulin, Total: 2.8 g/dL (ref 1.5–4.5)
Glucose: 105 mg/dL — ABNORMAL HIGH (ref 65–99)
POTASSIUM: 4.3 mmol/L (ref 3.5–5.2)
Sodium: 140 mmol/L (ref 134–144)
TOTAL PROTEIN: 7.1 g/dL (ref 6.0–8.5)

## 2018-07-27 LAB — POCT GLYCOSYLATED HEMOGLOBIN (HGB A1C): Hemoglobin A1C: 6.5 % — AB (ref 4.0–5.6)

## 2018-07-27 LAB — CBC
HEMATOCRIT: 38.8 % (ref 34.0–46.6)
HEMOGLOBIN: 13.3 g/dL (ref 11.1–15.9)
MCH: 30.9 pg (ref 26.6–33.0)
MCHC: 34.3 g/dL (ref 31.5–35.7)
MCV: 90 fL (ref 79–97)
Platelets: 358 10*3/uL (ref 150–450)
RBC: 4.3 x10E6/uL (ref 3.77–5.28)
RDW: 12.7 % (ref 12.3–15.4)
WBC: 5.3 10*3/uL (ref 3.4–10.8)

## 2018-07-27 MED ORDER — METFORMIN HCL 1000 MG PO TABS: 1000.0000 mg | ORAL_TABLET | Freq: Two times a day (BID) | ORAL | 3 refills | Status: DC

## 2018-07-27 MED ORDER — GLUCOSE BLOOD VI STRP: ORAL_STRIP | 1 refills | Status: DC

## 2018-07-27 MED ORDER — ROSUVASTATIN CALCIUM 10 MG PO TABS: 10.0000 mg | ORAL_TABLET | Freq: Every day | ORAL | 3 refills | Status: DC

## 2018-07-27 NOTE — Assessment & Plan Note (Signed)
Hemoglobin A1c of 6.5.  Will increase metformin to 1000 mg twice a day.  Will add Crestor 10 mg daily.  Follow-up in 3 months.

## 2018-07-27 NOTE — Progress Notes (Signed)
Wt Readings from Last 3 Encounters:  07/27/18 179 lb 6.4 oz (81.4 kg)  12/10/17 177 lb (80.3 kg)  07/19/17 189 lb 12.8 oz (86.1 kg)   Kerri Sims 47 y.o.   Chief Complaint  Patient presents with  . Diabetes    x 6 months follow up  . Medication Refill    Metformin and test strips    HISTORY OF PRESENT ILLNESS: This is a 47 y.o. female with history of diabetes here for follow-up.  Has no complaints or medical concerns.  Doing well.  Night nurse.  HPI   Prior to Admission medications   Medication Sig Start Date End Date Taking? Authorizing Provider  blood glucose meter kit and supplies KIT Dispense based on patient and insurance preference. Use up to four times daily as directed. (FOR ICD-9 250.00, 250.01). 07/21/17   Horald Pollen, MD  glucose blood (CONTOUR NEXT TEST) test strip CHECK GLUCOSE 4 TIMES DAILY 07/27/18   Horald Pollen, MD  Iron-FA-B Cmp-C-Biot-Probiotic (FUSION PLUS) CAPS Take 1 capsule every morning by mouth. Patient not taking: Reported on 07/27/2018 07/19/17   Horald Pollen, MD  metFORMIN (GLUCOPHAGE) 1000 MG tablet Take 1 tablet (1,000 mg total) by mouth 2 (two) times daily with a meal. 07/27/18   Cordelia Bessinger, Ines Bloomer, MD  rosuvastatin (CRESTOR) 10 MG tablet Take 1 tablet (10 mg total) by mouth daily. 07/27/18   Horald Pollen, MD    Allergies  Allergen Reactions  . Percocet [Oxycodone-Acetaminophen] Itching    Patient Active Problem List   Diagnosis Date Noted  . Type 2 diabetes mellitus without complication, without long-term current use of insulin ([place]) 12/10/2017    Past Medical History:  Diagnosis Date  . Medical history non-contributory   . PONV (postoperative nausea and vomiting)     Past Surgical History:  Procedure Laterality Date  . ABDOMINAL HYSTERECTOMY N/A 03/31/2013   Procedure: HYSTERECTOMY ABDOMINAL;  Surgeon: Lahoma Crocker, MD;  Location: [facility];  Service: Gynecology;  Laterality: N/A;   fibroids  . BILATERAL SALPINGECTOMY Bilateral 03/31/2013   Procedure: BILATERAL SALPINGECTOMY;  Surgeon: Lahoma Crocker, MD;  Location: [facility];  Service: Gynecology;  Laterality: Bilateral;  . CESAREAN SECTION      Social History   Socioeconomic History  . Marital status: Married    Spouse name: Not on file  . Number of children: Not on file  . Years of education: Not on file  . Highest education level: Not on file  Occupational History  . Not on file  Social Needs  . Financial resource strain: Not on file  . Food insecurity:    Worry: Not on file    Inability: Not on file  . Transportation needs:    Medical: Not on file    Non-medical: Not on file  Tobacco Use  . Smoking status: Never Smoker  . Smokeless tobacco: Never Used  Substance and Sexual Activity  . Alcohol use: No  . Drug use: No  . Sexual activity: Not on file  Lifestyle  . Physical activity:    Days per week: Not on file    Minutes per session: Not on file  . Stress: Not on file  Relationships  . Social connections:    Talks on phone: Not on file    Gets together: Not on file    Attends religious service: Not on file    Active member of club or organization: Not on file    Attends meetings of clubs or  organizations: Not on file    Relationship status: Not on file  . Intimate partner violence:    Fear of current or ex partner: Not on file    Emotionally abused: Not on file    Physically abused: Not on file    Forced sexual activity: Not on file  Other Topics Concern  . Not on file  Social History Narrative  . Not on file    Family History  Problem Relation Age of Onset  . Hyperlipidemia Mother   . Hypertension Mother   . Stroke Father      Review of Systems  Constitutional: Negative.  Negative for chills, fever and weight loss.  HENT: Negative.   Eyes: Negative.  Negative for blurred vision and double vision.  Respiratory: Negative.  Negative for cough and shortness of breath.     Cardiovascular: Negative.  Negative for chest pain and palpitations.  Gastrointestinal: Negative.  Negative for abdominal pain, diarrhea, nausea and vomiting.  Musculoskeletal: Negative.  Negative for myalgias and neck pain.  Skin: Negative.  Negative for rash.  Neurological: Negative.  Negative for dizziness and headaches.  All other systems reviewed and are negative.   Vitals:   07/27/18 1057  BP: 106/72  Pulse: 75  Resp: 16  Temp: 98.8 F (37.1 C)  SpO2: 100%    Physical Exam  Constitutional: She is oriented to person, place, and time. She appears well-developed and well-nourished.  HENT:  Head: Normocephalic and atraumatic.  Nose: Nose normal.  Mouth/Throat: Oropharynx is clear and moist.  Eyes: Pupils are equal, round, and reactive to light. Conjunctivae and EOM are normal.  Neck: Normal range of motion. Neck supple. No thyromegaly present.  Cardiovascular: Normal rate, regular rhythm and normal heart sounds.  Pulmonary/Chest: Effort normal and breath sounds normal.  Abdominal: Soft. There is no tenderness.  Musculoskeletal: Normal range of motion. She exhibits no edema.  Neurological: She is alert and oriented to person, place, and time. No sensory deficit. She exhibits normal muscle tone.  Skin: Skin is warm and dry. Capillary refill takes less than 2 seconds.  Psychiatric: She has a normal mood and affect. Her behavior is normal.  Vitals reviewed.  Results for orders placed or performed in visit on 07/27/18 (from the past 24 hour(s))  POCT glucose (manual entry)     Status: Abnormal   Collection Time: 07/27/18 11:19 AM  Result Value Ref Range   POC Glucose 105 (A) 70 - 99 mg/dl  POCT glycosylated hemoglobin (Hb A1C)     Status: Abnormal   Collection Time: 07/27/18 11:25 AM  Result Value Ref Range   Hemoglobin A1C 6.5 (A) 4.0 - 5.6 %   HbA1c POC (<> result, manual entry)     HbA1c, POC (prediabetic range)     HbA1c, POC (controlled diabetic range)     A total  of 25 minutes was spent in the room with the patient, greater than 50% of which was in counseling/coordination of care regarding chronic medical conditions, treatment, changing of medications and need for follow-up in 3 months.    ASSESSMENT & PLAN:  Type 2 diabetes mellitus without complication, without long-term current use of insulin (HCC) Hemoglobin A1c of 6.5.  Will increase metformin to 1000 mg twice a day.  Will add Crestor 10 mg daily.  Follow-up in 3 months.  Lakenzie was seen today for diabetes and medication refill.  Diagnoses and all orders for this visit:  Type 2 diabetes mellitus without complication, without long-term current  use of insulin (HCC) -     POCT glycosylated hemoglobin (Hb A1C) -     POCT glucose (manual entry) -     Comprehensive metabolic panel -     CBC -     Lipid panel -     metFORMIN (GLUCOPHAGE) 1000 MG tablet; Take 1 tablet (1,000 mg total) by mouth 2 (two) times daily with a meal. -     rosuvastatin (CRESTOR) 10 MG tablet; Take 1 tablet (10 mg total) by mouth daily.  Dyslipidemia  Other orders -     glucose blood (CONTOUR NEXT TEST) test strip; CHECK GLUCOSE 4 TIMES DAILY    Patient Instructions       If you have lab work done today you will be contacted with your lab results within the next 2 weeks.  If you have not heard from [place] then please contact us. The fastest way to get your results is to register for My Chart.   IF you received an x-ray today, you will receive an invoice from [facility]. Please contact [facility] at [phone] with questions or concerns regarding your invoice.   IF you received labwork today, you will receive an invoice from [place]. Please contact [facility] at [phone] with questions or concerns regarding your invoice.   Our billing staff will not be able to assist you with questions regarding bills from these companies.  You will be contacted with the lab results as soon as they are  available. The fastest way to get your results is to activate your My Chart account. Instructions are located on the last page of this paperwork. If you have not heard from [place] regarding the results in 2 weeks, please contact this office.     Diabetes Mellitus and Nutrition When you have diabetes (diabetes mellitus), it is very important to have healthy eating habits because your blood sugar (glucose) levels are greatly affected by what you eat and drink. Eating healthy foods in the appropriate amounts, at about the same times every day, can help you:  Control your blood glucose.  Lower your risk of heart disease.  Improve your blood pressure.  Reach or maintain a healthy weight.  Every person with diabetes is different, and each person has different needs for a meal plan. Your health care provider may recommend that you work with a diet and nutrition specialist (dietitian) to make a meal plan that is best for you. Your meal plan may vary depending on factors such as:  The calories you need.  The medicines you take.  Your weight.  Your blood glucose, blood pressure, and cholesterol levels.  Your activity level.  Other health conditions you have, such as heart or kidney disease.  How do carbohydrates affect me? Carbohydrates affect your blood glucose level more than any other type of food. Eating carbohydrates naturally increases the amount of glucose in your blood. Carbohydrate counting is a method for keeping track of how many carbohydrates you eat. Counting carbohydrates is important to keep your blood glucose at a healthy level, especially if you use insulin or take certain oral diabetes medicines. It is important to know how many carbohydrates you can safely have in each meal. This is different for every person. Your dietitian can help you calculate how many carbohydrates you should have at each meal and for snack. Foods that contain carbohydrates include:  Bread, cereal, rice,  pasta, and crackers.  Potatoes and corn.  Peas, beans, and lentils.  Milk and yogurt.  Fruit and juice.  Desserts, such as cakes, cookies, ice cream, and candy.  How does alcohol affect me? Alcohol can cause a sudden decrease in blood glucose (hypoglycemia), especially if you use insulin or take certain oral diabetes medicines. Hypoglycemia can be a life-threatening condition. Symptoms of hypoglycemia (sleepiness, dizziness, and confusion) are similar to symptoms of having too much alcohol. If your health care provider says that alcohol is safe for you, follow these guidelines:  Limit alcohol intake to no more than 1 drink per day for nonpregnant women and 2 drinks per day for men. One drink equals 12 oz of beer, 5 oz of wine, or 1 oz of hard liquor.  Do not drink on an empty stomach.  Keep yourself hydrated with water, diet soda, or unsweetened iced tea.  Keep in mind that regular soda, juice, and other mixers may contain a lot of sugar and must be counted as carbohydrates.  What are tips for following this plan? Reading food labels  Start by checking the serving size on the label. The amount of calories, carbohydrates, fats, and other nutrients listed on the label are based on one serving of the food. Many foods contain more than one serving per package.  Check the total grams (g) of carbohydrates in one serving. You can calculate the number of servings of carbohydrates in one serving by dividing the total carbohydrates by 15. For example, if a food has 30 g of total carbohydrates, it would be equal to 2 servings of carbohydrates.  Check the number of grams (g) of saturated and trans fats in one serving. Choose foods that have low or no amount of these fats.  Check the number of milligrams (mg) of sodium in one serving. Most people should limit total sodium intake to less than 2,300 mg per day.  Always check the nutrition information of foods labeled as "low-fat" or "nonfat".  These foods may be higher in added sugar or refined carbohydrates and should be avoided.  Talk to your dietitian to identify your daily goals for nutrients listed on the label. Shopping  Avoid buying canned, premade, or processed foods. These foods tend to be high in fat, sodium, and added sugar.  Shop around the outside edge of the grocery store. This includes fresh fruits and vegetables, bulk grains, fresh meats, and fresh dairy. Cooking  Use low-heat cooking methods, such as baking, instead of high-heat cooking methods like deep frying.  Cook using healthy oils, such as olive, canola, or sunflower oil.  Avoid cooking with butter, cream, or high-fat meats. Meal planning  Eat meals and snacks regularly, preferably at the same times every day. Avoid going long periods of time without eating.  Eat foods high in fiber, such as fresh fruits, vegetables, beans, and whole grains. Talk to your dietitian about how many servings of carbohydrates you can eat at each meal.  Eat 4-6 ounces of lean protein each day, such as lean meat, chicken, fish, eggs, or tofu. 1 ounce is equal to 1 ounce of meat, chicken, or fish, 1 egg, or 1/4 cup of tofu.  Eat some foods each day that contain healthy fats, such as avocado, nuts, seeds, and fish. Lifestyle   Check your blood glucose regularly.  Exercise at least 30 minutes 5 or more days each week, or as told by your health care provider.  Take medicines as told by your health care provider.  Do not use any products that contain nicotine or tobacco, such as cigarettes and  e-cigarettes. If you need help quitting, ask your health care provider.  Work with a Social worker or diabetes educator to identify strategies to manage stress and any emotional and social challenges. What are some questions to ask my health care provider?  Do I need to meet with a diabetes educator?  Do I need to meet with a dietitian?  What number can I call if I have  questions?  When are the best times to check my blood glucose? Where to find more information:  American Diabetes Association: diabetes.org/food-and-fitness/food  Academy of Nutrition and Dietetics: PokerClues.dk  [employer] of Diabetes and Digestive and Kidney Diseases (NIH): ContactWire.be Summary  A healthy meal plan will help you control your blood glucose and maintain a healthy lifestyle.  Working with a diet and nutrition specialist (dietitian) can help you make a meal plan that is best for you.  Keep in mind that carbohydrates and alcohol have immediate effects on your blood glucose levels. It is important to count carbohydrates and to use alcohol carefully. This information is not intended to replace advice given to you by your health care provider. Make sure you discuss any questions you have with your health care provider. Document Released: 05/21/2005 Document Revised: 09/28/2016 Document Reviewed: 09/28/2016 Elsevier Interactive Patient Education  2018 [employer].     Agustina Caroli, MD [facility]

## 2018-07-27 NOTE — Patient Instructions (Addendum)
   If you have lab work done today you will be contacted with your lab results within the next 2 weeks.  If you have not heard from us then please contact us. The fastest way to get your results is to register for My Chart.   IF you received an x-ray today, you will receive an invoice from [facility]. Please contact [facility] at [phone] with questions or concerns regarding your invoice.   IF you received labwork today, you will receive an invoice from [facility]. Please contact [facility] at [phone] with questions or concerns regarding your invoice.   Our billing staff will not be able to assist you with questions regarding bills from these companies.  You will be contacted with the lab results as soon as they are available. The fastest way to get your results is to activate your My Chart account. Instructions are located on the last page of this paperwork. If you have not heard from us regarding the results in 2 weeks, please contact this office.     Diabetes Mellitus and Nutrition When you have diabetes (diabetes mellitus), it is very important to have healthy eating habits because your blood sugar (glucose) levels are greatly affected by what you eat and drink. Eating healthy foods in the appropriate amounts, at about the same times every day, can help you:  Control your blood glucose.  Lower your risk of heart disease.  Improve your blood pressure.  Reach or maintain a healthy weight.  Every person with diabetes is different, and each person has different needs for a meal plan. Your health care provider may recommend that you work with a diet and nutrition specialist (dietitian) to make a meal plan that is best for you. Your meal plan may vary depending on factors such as:  The calories you need.  The medicines you take.  Your weight.  Your blood glucose, blood pressure, and cholesterol levels.  Your activity level.  Other health conditions you  have, such as heart or kidney disease.  How do carbohydrates affect me? Carbohydrates affect your blood glucose level more than any other type of food. Eating carbohydrates naturally increases the amount of glucose in your blood. Carbohydrate counting is a method for keeping track of how many carbohydrates you eat. Counting carbohydrates is important to keep your blood glucose at a healthy level, especially if you use insulin or take certain oral diabetes medicines. It is important to know how many carbohydrates you can safely have in each meal. This is different for every person. Your dietitian can help you calculate how many carbohydrates you should have at each meal and for snack. Foods that contain carbohydrates include:  Bread, cereal, rice, pasta, and crackers.  Potatoes and corn.  Peas, beans, and lentils.  Milk and yogurt.  Fruit and juice.  Desserts, such as cakes, cookies, ice cream, and candy.  How does alcohol affect me? Alcohol can cause a sudden decrease in blood glucose (hypoglycemia), especially if you use insulin or take certain oral diabetes medicines. Hypoglycemia can be a life-threatening condition. Symptoms of hypoglycemia (sleepiness, dizziness, and confusion) are similar to symptoms of having too much alcohol. If your health care provider says that alcohol is safe for you, follow these guidelines:  Limit alcohol intake to no more than 1 drink per day for nonpregnant women and 2 drinks per day for men. One drink equals 12 oz of beer, 5 oz of wine, or 1 oz of hard liquor.  Do   not drink on an empty stomach.  Keep yourself hydrated with water, diet soda, or unsweetened iced tea.  Keep in mind that regular soda, juice, and other mixers may contain a lot of sugar and must be counted as carbohydrates.  What are tips for following this plan? Reading food labels  Start by checking the serving size on the label. The amount of calories, carbohydrates, fats, and other  nutrients listed on the label are based on one serving of the food. Many foods contain more than one serving per package.  Check the total grams (g) of carbohydrates in one serving. You can calculate the number of servings of carbohydrates in one serving by dividing the total carbohydrates by 15. For example, if a food has 30 g of total carbohydrates, it would be equal to 2 servings of carbohydrates.  Check the number of grams (g) of saturated and trans fats in one serving. Choose foods that have low or no amount of these fats.  Check the number of milligrams (mg) of sodium in one serving. Most people should limit total sodium intake to less than 2,300 mg per day.  Always check the nutrition information of foods labeled as "low-fat" or "nonfat". These foods may be higher in added sugar or refined carbohydrates and should be avoided.  Talk to your dietitian to identify your daily goals for nutrients listed on the label. Shopping  Avoid buying canned, premade, or processed foods. These foods tend to be high in fat, sodium, and added sugar.  Shop around the outside edge of the grocery store. This includes fresh fruits and vegetables, bulk grains, fresh meats, and fresh dairy. Cooking  Use low-heat cooking methods, such as baking, instead of high-heat cooking methods like deep frying.  Cook using healthy oils, such as olive, canola, or sunflower oil.  Avoid cooking with butter, cream, or high-fat meats. Meal planning  Eat meals and snacks regularly, preferably at the same times every day. Avoid going long periods of time without eating.  Eat foods high in fiber, such as fresh fruits, vegetables, beans, and whole grains. Talk to your dietitian about how many servings of carbohydrates you can eat at each meal.  Eat 4-6 ounces of lean protein each day, such as lean meat, chicken, fish, eggs, or tofu. 1 ounce is equal to 1 ounce of meat, chicken, or fish, 1 egg, or 1/4 cup of tofu.  Eat some  foods each day that contain healthy fats, such as avocado, nuts, seeds, and fish. Lifestyle   Check your blood glucose regularly.  Exercise at least 30 minutes 5 or more days each week, or as told by your health care provider.  Take medicines as told by your health care provider.  Do not use any products that contain nicotine or tobacco, such as cigarettes and e-cigarettes. If you need help quitting, ask your health care provider.  Work with a counselor or diabetes educator to identify strategies to manage stress and any emotional and social challenges. What are some questions to ask my health care provider?  Do I need to meet with a diabetes educator?  Do I need to meet with a dietitian?  What number can I call if I have questions?  When are the best times to check my blood glucose? Where to find more information:  American Diabetes Association: diabetes.org/food-and-fitness/food  Academy of Nutrition and Dietetics: www.eatright.org/resources/health/diseases-and-conditions/diabetes  National Institute of Diabetes and Digestive and Kidney Diseases (NIH): www.niddk.nih.gov/health-information/diabetes/overview/diet-eating-physical-activity Summary  A healthy meal plan will   help you control your blood glucose and maintain a healthy lifestyle.  Working with a diet and nutrition specialist (dietitian) can help you make a meal plan that is best for you.  Keep in mind that carbohydrates and alcohol have immediate effects on your blood glucose levels. It is important to count carbohydrates and to use alcohol carefully. This information is not intended to replace advice given to you by your health care provider. Make sure you discuss any questions you have with your health care provider. Document Released: 05/21/2005 Document Revised: 09/28/2016 Document Reviewed: 09/28/2016 Elsevier Interactive Patient Education  2018 Elsevier Inc.  

## 2018-07-28 ENCOUNTER — Encounter: Payer: Self-pay | Admitting: Radiology

## 2018-09-15 ENCOUNTER — Telehealth: Payer: Self-pay | Admitting: Emergency Medicine

## 2018-09-15 ENCOUNTER — Other Ambulatory Visit: Payer: Self-pay

## 2018-09-15 DIAGNOSIS — Z1231 Encounter for screening mammogram for malignant neoplasm of breast: Secondary | ICD-10-CM

## 2018-09-15 NOTE — Telephone Encounter (Signed)
Referral has been sent and pt should receive a call to schedule.

## 2018-09-15 NOTE — Telephone Encounter (Signed)
Copied from [place] [phone]. Topic: General - Other >> Sep 15, 2018 10:39 AM Keene Breath wrote: Reason for CRM: Patient called to request a referral for a mammogram.  Patient stated that she would like to go to [facility]. If possible.  Please advise and contact patient when the referral has been sent.  CB# [phone]

## 2018-09-21 DIAGNOSIS — Z1231 Encounter for screening mammogram for malignant neoplasm of breast: Secondary | ICD-10-CM | POA: Diagnosis not present

## 2018-09-21 LAB — HM MAMMOGRAPHY

## 2018-10-03 ENCOUNTER — Encounter: Payer: Self-pay | Admitting: *Deleted

## 2018-11-02 ENCOUNTER — Encounter: Payer: Self-pay | Admitting: Emergency Medicine

## 2018-11-02 ENCOUNTER — Other Ambulatory Visit: Payer: Self-pay

## 2018-11-02 ENCOUNTER — Ambulatory Visit: Payer: BLUE CROSS/BLUE SHIELD | Admitting: Emergency Medicine

## 2018-11-02 VITALS — BP 99/65 | HR 74 | Temp 98.6°F | Resp 16 | Ht 62.5 in | Wt 178.2 lb

## 2018-11-02 DIAGNOSIS — E785 Hyperlipidemia, unspecified: Secondary | ICD-10-CM | POA: Diagnosis not present

## 2018-11-02 DIAGNOSIS — E119 Type 2 diabetes mellitus without complications: Secondary | ICD-10-CM

## 2018-11-02 LAB — COMPREHENSIVE METABOLIC PANEL
ALBUMIN: 4.6 g/dL (ref 3.8–4.8)
ALT: 12 IU/L (ref 0–32)
AST: 17 IU/L (ref 0–40)
Albumin/Globulin Ratio: 1.8 (ref 1.2–2.2)
Alkaline Phosphatase: 50 IU/L (ref 39–117)
BILIRUBIN TOTAL: 0.3 mg/dL (ref 0.0–1.2)
BUN/Creatinine Ratio: 12 (ref 9–23)
BUN: 8 mg/dL (ref 6–24)
CHLORIDE: 104 mmol/L (ref 96–106)
CO2: 23 mmol/L (ref 20–29)
CREATININE: 0.68 mg/dL (ref 0.57–1.00)
Calcium: 9.8 mg/dL (ref 8.7–10.2)
GFR calc non Af Amer: 104 mL/min/{1.73_m2} (ref >59)
GFR, EST AFRICAN AMERICAN: 120 mL/min/{1.73_m2} (ref >59)
GLUCOSE: 96 mg/dL (ref 65–99)
Globulin, Total: 2.6 g/dL (ref 1.5–4.5)
Potassium: 4.5 mmol/L (ref 3.5–5.2)
Sodium: 143 mmol/L (ref 134–144)
Total Protein: 7.2 g/dL (ref 6.0–8.5)

## 2018-11-02 LAB — LIPID PANEL
Chol/HDL Ratio: 2.3 ratio (ref 0.0–4.4)
Cholesterol, Total: 141 mg/dL (ref 100–199)
HDL: 62 mg/dL (ref >39)
LDL CALC: 67 mg/dL (ref 0–99)
Triglycerides: 62 mg/dL (ref 0–149)
VLDL CHOLESTEROL CAL: 12 mg/dL (ref 5–40)

## 2018-11-02 LAB — CBC WITH DIFFERENTIAL/PLATELET
BASOS ABS: 0 10*3/uL (ref 0.0–0.2)
Basos: 1 %
EOS (ABSOLUTE): 0 10*3/uL (ref 0.0–0.4)
Eos: 1 %
Hematocrit: 36 % (ref 34.0–46.6)
Hemoglobin: 12.3 g/dL (ref 11.1–15.9)
IMMATURE GRANS (ABS): 0 10*3/uL (ref 0.0–0.1)
IMMATURE GRANULOCYTES: 0 %
LYMPHS: 47 %
Lymphocytes Absolute: 2.3 10*3/uL (ref 0.7–3.1)
MCH: 30.1 pg (ref 26.6–33.0)
MCHC: 34.2 g/dL (ref 31.5–35.7)
MCV: 88 fL (ref 79–97)
Monocytes Absolute: 0.5 10*3/uL (ref 0.1–0.9)
Monocytes: 10 %
NEUTROS PCT: 41 %
Neutrophils Absolute: 1.9 10*3/uL (ref 1.4–7.0)
PLATELETS: 331 10*3/uL (ref 150–450)
RBC: 4.08 x10E6/uL (ref 3.77–5.28)
RDW: 12.4 % (ref 11.7–15.4)
WBC: 4.8 10*3/uL (ref 3.4–10.8)

## 2018-11-02 LAB — POCT GLYCOSYLATED HEMOGLOBIN (HGB A1C): Hemoglobin A1C: 6.1 % — AB (ref 4.0–5.6)

## 2018-11-02 LAB — GLUCOSE, POCT (MANUAL RESULT ENTRY): POC Glucose: 102 mg/dl — AB (ref 70–99)

## 2018-11-02 NOTE — Assessment & Plan Note (Signed)
Well-controlled with hemoglobin A1c today at 6.1.  Continue present treatment and diet.  Follow-up in 6 months.

## 2018-11-02 NOTE — Progress Notes (Signed)
Lab Results  Component Value Date   HGBA1C 6.5 (A) 07/27/2018   Lab Results  Component Value Date   CHOL 213 (H) 07/27/2018   HDL 63 07/27/2018   LDLCALC 131 (H) 07/27/2018   TRIG 96 07/27/2018   CHOLHDL 3.4 07/27/2018   BP Readings from Last 3 Encounters:  11/02/18 99/65  07/27/18 106/72  12/10/17 108/75   Kerri Sims 48 y.o.   Chief Complaint  Patient presents with  . Diabetes    follow up 3 months    HISTORY OF PRESENT ILLNESS: This is a 48 y.o. female with history of diabetes here for follow-up.  Has no complaints or medical concerns. Patient is a nurse presently working night shifts. Taking metformin and Crestor.  HPI   Prior to Admission medications   Medication Sig Start Date End Date Taking? Authorizing Provider  blood glucose meter kit and supplies KIT Dispense based on patient and insurance preference. Use up to four times daily as directed. (FOR ICD-9 250.00, 250.01). 07/21/17  Yes Sagardia, Ines Bloomer, MD  glucose blood (CONTOUR NEXT TEST) test strip CHECK GLUCOSE 4 TIMES DAILY 07/27/18  Yes Horald Pollen, MD  metFORMIN (GLUCOPHAGE) 1000 MG tablet Take 1 tablet (1,000 mg total) by mouth 2 (two) times daily with a meal. 07/27/18  Yes Sagardia, Ines Bloomer, MD  rosuvastatin (CRESTOR) 10 MG tablet Take 1 tablet (10 mg total) by mouth daily. 07/27/18  Yes Sagardia, Ines Bloomer, MD  Iron-FA-B Cmp-C-Biot-Probiotic (FUSION PLUS) CAPS Take 1 capsule every morning by mouth. Patient not taking: Reported on 11/02/2018 07/19/17   Horald Pollen, MD    Allergies  Allergen Reactions  . Percocet [Oxycodone-Acetaminophen] Itching    Patient Active Problem List   Diagnosis Date Noted  . Dyslipidemia 07/27/2018  . Type 2 diabetes mellitus without complication, without long-term current use of insulin ([place]) 12/10/2017    Past Medical History:  Diagnosis Date  . Medical history non-contributory   . PONV (postoperative nausea and vomiting)      Past Surgical History:  Procedure Laterality Date  . ABDOMINAL HYSTERECTOMY N/A 03/31/2013   Procedure: HYSTERECTOMY ABDOMINAL;  Surgeon: Lahoma Crocker, MD;  Location: [facility];  Service: Gynecology;  Laterality: N/A;  fibroids  . BILATERAL SALPINGECTOMY Bilateral 03/31/2013   Procedure: BILATERAL SALPINGECTOMY;  Surgeon: Lahoma Crocker, MD;  Location: [facility];  Service: Gynecology;  Laterality: Bilateral;  . CESAREAN SECTION      Social History   Socioeconomic History  . Marital status: Married    Spouse name: Not on file  . Number of children: Not on file  . Years of education: Not on file  . Highest education level: Not on file  Occupational History  . Not on file  Social Needs  . Financial resource strain: Not on file  . Food insecurity:    Worry: Not on file    Inability: Not on file  . Transportation needs:    Medical: Not on file    Non-medical: Not on file  Tobacco Use  . Smoking status: Never Smoker  . Smokeless tobacco: Never Used  Substance and Sexual Activity  . Alcohol use: No  . Drug use: No  . Sexual activity: Not on file  Lifestyle  . Physical activity:    Days per week: Not on file    Minutes per session: Not on file  . Stress: Not on file  Relationships  . Social connections:    Talks on phone: Not on file  Gets together: Not on file    Attends religious service: Not on file    Active member of club or organization: Not on file    Attends meetings of clubs or organizations: Not on file    Relationship status: Not on file  . Intimate partner violence:    Fear of current or ex partner: Not on file    Emotionally abused: Not on file    Physically abused: Not on file    Forced sexual activity: Not on file  Other Topics Concern  . Not on file  Social History Narrative  . Not on file    Family History  Problem Relation Age of Onset  . Hyperlipidemia Mother   . Hypertension Mother   . Stroke Father      Review of Systems   Constitutional: Negative.  Negative for chills and fever.  HENT: Negative.   Eyes: Negative.  Negative for blurred vision and double vision.  Respiratory: Negative.  Negative for cough and shortness of breath.   Cardiovascular: Negative.  Negative for chest pain and palpitations.  Gastrointestinal: Negative.  Negative for abdominal pain, diarrhea, nausea and vomiting.  Genitourinary: Negative.   Musculoskeletal: Negative.   Skin: Negative.  Negative for rash.  Neurological: Negative.  Negative for dizziness and headaches.  Endo/Heme/Allergies: Negative.   All other systems reviewed and are negative.  Vitals:   11/02/18 1020  BP: 99/65  Pulse: 74  Resp: 16  Temp: 98.6 F (37 C)  SpO2: 98%     Physical Exam Vitals signs reviewed.  Constitutional:      Appearance: Normal appearance.  HENT:     Head: Normocephalic and atraumatic.     Mouth/Throat:     Mouth: Mucous membranes are moist.     Pharynx: Oropharynx is clear.  Eyes:     Extraocular Movements: Extraocular movements intact.     Conjunctiva/sclera: Conjunctivae normal.     Pupils: Pupils are equal, round, and reactive to light.  Neck:     Musculoskeletal: Normal range of motion.  Cardiovascular:     Rate and Rhythm: Normal rate and regular rhythm.     Heart sounds: Normal heart sounds.  Pulmonary:     Effort: Pulmonary effort is normal.     Breath sounds: Normal breath sounds.  Abdominal:     Palpations: Abdomen is soft.     Tenderness: There is no abdominal tenderness.  Musculoskeletal: Normal range of motion.  Skin:    General: Skin is warm and dry.     Capillary Refill: Capillary refill takes less than 2 seconds.  Neurological:     General: No focal deficit present.     Mental Status: She is alert and oriented to person, place, and time.  Psychiatric:        Mood and Affect: Mood normal.        Behavior: Behavior normal.    Results for orders placed or performed in visit on 11/02/18 (from the past  24 hour(s))  POCT glucose (manual entry)     Status: Abnormal   Collection Time: 11/02/18 10:57 AM  Result Value Ref Range   POC Glucose 102 (A) 70 - 99 mg/dl  POCT glycosylated hemoglobin (Hb A1C)     Status: Abnormal   Collection Time: 11/02/18 10:59 AM  Result Value Ref Range   Hemoglobin A1C 6.1 (A) 4.0 - 5.6 %   HbA1c POC (<> result, manual entry)     HbA1c, POC (prediabetic range)  HbA1c, POC (controlled diabetic range)      A total of 25 minutes was spent in the room with the patient, greater than 50% of which was in counseling/coordination of care regarding diabetes, management, medications, review of blood results, lifestyle choices, nutrition, and need for follow-up.  ASSESSMENT & PLAN: Type 2 diabetes mellitus without complication, without long-term current use of insulin ([facility]) Well-controlled with hemoglobin A1c today at 6.1.  Continue present treatment and diet.  Follow-up in 6 months.  Harlei was seen today for diabetes.  Diagnoses and all orders for this visit:  Type 2 diabetes mellitus without complication, without long-term current use of insulin (HCC) -     POCT glucose (manual entry) -     POCT glycosylated hemoglobin (Hb A1C) -     CBC with Differential/Platelet -     Comprehensive metabolic panel -     Lipid panel  Dyslipidemia    Patient Instructions       If you have lab work done today you will be contacted with your lab results within the next 2 weeks.  If you have not heard from [place] then please contact us. The fastest way to get your results is to register for My Chart.   IF you received an x-ray today, you will receive an invoice from [facility]. Please contact [facility] at [phone] with questions or concerns regarding your invoice.   IF you received labwork today, you will receive an invoice from [facility]. Please contact [facility] at [phone] with questions or concerns regarding your invoice.   Our billing staff  will not be able to assist you with questions regarding bills from these companies.  You will be contacted with the lab results as soon as they are available. The fastest way to get your results is to activate your My Chart account. Instructions are located on the last page of this paperwork. If you have not heard from [place] regarding the results in 2 weeks, please contact this office.     Diabetes Mellitus and Nutrition, Adult When you have diabetes (diabetes mellitus), it is very important to have healthy eating habits because your blood sugar (glucose) levels are greatly affected by what you eat and drink. Eating healthy foods in the appropriate amounts, at about the same times every day, can help you:  Control your blood glucose.  Lower your risk of heart disease.  Improve your blood pressure.  Reach or maintain a healthy weight. Every person with diabetes is different, and each person has different needs for a meal plan. Your health care provider may recommend that you work with a diet and nutrition specialist (dietitian) to make a meal plan that is best for you. Your meal plan may vary depending on factors such as:  The calories you need.  The medicines you take.  Your weight.  Your blood glucose, blood pressure, and cholesterol levels.  Your activity level.  Other health conditions you have, such as heart or kidney disease. How do carbohydrates affect me? Carbohydrates, also called carbs, affect your blood glucose level more than any other type of food. Eating carbs naturally raises the amount of glucose in your blood. Carb counting is a method for keeping track of how many carbs you eat. Counting carbs is important to keep your blood glucose at a healthy level, especially if you use insulin or take certain oral diabetes medicines. It is important to know how many carbs you can safely have in each meal. This is  different for every person. Your dietitian can help you calculate how  many carbs you should have at each meal and for each snack. Foods that contain carbs include:  Bread, cereal, rice, pasta, and crackers.  Potatoes and corn.  Peas, beans, and lentils.  Milk and yogurt.  Fruit and juice.  Desserts, such as cakes, cookies, ice cream, and candy. How does alcohol affect me? Alcohol can cause a sudden decrease in blood glucose (hypoglycemia), especially if you use insulin or take certain oral diabetes medicines. Hypoglycemia can be a life-threatening condition. Symptoms of hypoglycemia (sleepiness, dizziness, and confusion) are similar to symptoms of having too much alcohol. If your health care provider says that alcohol is safe for you, follow these guidelines:  Limit alcohol intake to no more than 1 drink per day for nonpregnant women and 2 drinks per day for men. One drink equals 12 oz of beer, 5 oz of wine, or 1 oz of hard liquor.  Do not drink on an empty stomach.  Keep yourself hydrated with water, diet soda, or unsweetened iced tea.  Keep in mind that regular soda, juice, and other mixers may contain a lot of sugar and must be counted as carbs. What are tips for following this plan?  Reading food labels  Start by checking the serving size on the "Nutrition Facts" label of packaged foods and drinks. The amount of calories, carbs, fats, and other nutrients listed on the label is based on one serving of the item. Many items contain more than one serving per package.  Check the total grams (g) of carbs in one serving. You can calculate the number of servings of carbs in one serving by dividing the total carbs by 15. For example, if a food has 30 g of total carbs, it would be equal to 2 servings of carbs.  Check the number of grams (g) of saturated and trans fats in one serving. Choose foods that have low or no amount of these fats.  Check the number of milligrams (mg) of salt (sodium) in one serving. Most people should limit total sodium intake to  less than 2,300 mg per day.  Always check the nutrition information of foods labeled as "low-fat" or "nonfat". These foods may be higher in added sugar or refined carbs and should be avoided.  Talk to your dietitian to identify your daily goals for nutrients listed on the label. Shopping  Avoid buying canned, premade, or processed foods. These foods tend to be high in fat, sodium, and added sugar.  Shop around the outside edge of the grocery store. This includes fresh fruits and vegetables, bulk grains, fresh meats, and fresh dairy. Cooking  Use low-heat cooking methods, such as baking, instead of high-heat cooking methods like deep frying.  Cook using healthy oils, such as olive, canola, or sunflower oil.  Avoid cooking with butter, cream, or high-fat meats. Meal planning  Eat meals and snacks regularly, preferably at the same times every day. Avoid going long periods of time without eating.  Eat foods high in fiber, such as fresh fruits, vegetables, beans, and whole grains. Talk to your dietitian about how many servings of carbs you can eat at each meal.  Eat 4-6 ounces (oz) of lean protein each day, such as lean meat, chicken, fish, eggs, or tofu. One oz of lean protein is equal to: ? 1 oz of meat, chicken, or fish. ? 1 egg. ?  cup of tofu.  Eat some foods each day that  contain healthy fats, such as avocado, nuts, seeds, and fish. Lifestyle  Check your blood glucose regularly.  Exercise regularly as told by your health care provider. This may include: ? 150 minutes of moderate-intensity or vigorous-intensity exercise each week. This could be brisk walking, biking, or water aerobics. ? Stretching and doing strength exercises, such as yoga or weightlifting, at least 2 times a week.  Take medicines as told by your health care provider.  Do not use any products that contain nicotine or tobacco, such as cigarettes and e-cigarettes. If you need help quitting, ask your health care  provider.  Work with a Social worker or diabetes educator to identify strategies to manage stress and any emotional and social challenges. Questions to ask a health care provider  Do I need to meet with a diabetes educator?  Do I need to meet with a dietitian?  What number can I call if I have questions?  When are the best times to check my blood glucose? Where to find more information:  American Diabetes Association: diabetes.org  Academy of Nutrition and Dietetics: www.eatright.[employer] of Diabetes and Digestive and Kidney Diseases (NIH): [employer] Summary  A healthy meal plan will help you control your blood glucose and maintain a healthy lifestyle.  Working with a diet and nutrition specialist (dietitian) can help you make a meal plan that is best for you.  Keep in mind that carbohydrates (carbs) and alcohol have immediate effects on your blood glucose levels. It is important to count carbs and to use alcohol carefully. This information is not intended to replace advice given to you by your health care provider. Make sure you discuss any questions you have with your health care provider. Document Released: 05/21/2005 Document Revised: 03/24/2017 Document Reviewed: 09/28/2016 Elsevier Interactive Patient Education  2019 Elsevier Inc.      Agustina Caroli, MD [facility]

## 2018-11-02 NOTE — Patient Instructions (Addendum)
   If you have lab work done today you will be contacted with your lab results within the next 2 weeks.  If you have not heard from us then please contact us. The fastest way to get your results is to register for My Chart.   IF you received an x-ray today, you will receive an invoice from [facility]. Please contact [facility] at [phone] with questions or concerns regarding your invoice.   IF you received labwork today, you will receive an invoice from [facility]. Please contact [facility] at [phone] with questions or concerns regarding your invoice.   Our billing staff will not be able to assist you with questions regarding bills from these companies.  You will be contacted with the lab results as soon as they are available. The fastest way to get your results is to activate your My Chart account. Instructions are located on the last page of this paperwork. If you have not heard from us regarding the results in 2 weeks, please contact this office.     Diabetes Mellitus and Nutrition, Adult When you have diabetes (diabetes mellitus), it is very important to have healthy eating habits because your blood sugar (glucose) levels are greatly affected by what you eat and drink. Eating healthy foods in the appropriate amounts, at about the same times every day, can help you:  Control your blood glucose.  Lower your risk of heart disease.  Improve your blood pressure.  Reach or maintain a healthy weight. Every person with diabetes is different, and each person has different needs for a meal plan. Your health care provider may recommend that you work with a diet and nutrition specialist (dietitian) to make a meal plan that is best for you. Your meal plan may vary depending on factors such as:  The calories you need.  The medicines you take.  Your weight.  Your blood glucose, blood pressure, and cholesterol levels.  Your activity level.  Other health conditions  you have, such as heart or kidney disease. How do carbohydrates affect me? Carbohydrates, also called carbs, affect your blood glucose level more than any other type of food. Eating carbs naturally raises the amount of glucose in your blood. Carb counting is a method for keeping track of how many carbs you eat. Counting carbs is important to keep your blood glucose at a healthy level, especially if you use insulin or take certain oral diabetes medicines. It is important to know how many carbs you can safely have in each meal. This is different for every person. Your dietitian can help you calculate how many carbs you should have at each meal and for each snack. Foods that contain carbs include:  Bread, cereal, rice, pasta, and crackers.  Potatoes and corn.  Peas, beans, and lentils.  Milk and yogurt.  Fruit and juice.  Desserts, such as cakes, cookies, ice cream, and candy. How does alcohol affect me? Alcohol can cause a sudden decrease in blood glucose (hypoglycemia), especially if you use insulin or take certain oral diabetes medicines. Hypoglycemia can be a life-threatening condition. Symptoms of hypoglycemia (sleepiness, dizziness, and confusion) are similar to symptoms of having too much alcohol. If your health care provider says that alcohol is safe for you, follow these guidelines:  Limit alcohol intake to no more than 1 drink per day for nonpregnant women and 2 drinks per day for men. One drink equals 12 oz of beer, 5 oz of wine, or 1 oz of hard liquor.    Do not drink on an empty stomach.  Keep yourself hydrated with water, diet soda, or unsweetened iced tea.  Keep in mind that regular soda, juice, and other mixers may contain a lot of sugar and must be counted as carbs. What are tips for following this plan?  Reading food labels  Start by checking the serving size on the "Nutrition Facts" label of packaged foods and drinks. The amount of calories, carbs, fats, and other  nutrients listed on the label is based on one serving of the item. Many items contain more than one serving per package.  Check the total grams (g) of carbs in one serving. You can calculate the number of servings of carbs in one serving by dividing the total carbs by 15. For example, if a food has 30 g of total carbs, it would be equal to 2 servings of carbs.  Check the number of grams (g) of saturated and trans fats in one serving. Choose foods that have low or no amount of these fats.  Check the number of milligrams (mg) of salt (sodium) in one serving. Most people should limit total sodium intake to less than 2,300 mg per day.  Always check the nutrition information of foods labeled as "low-fat" or "nonfat". These foods may be higher in added sugar or refined carbs and should be avoided.  Talk to your dietitian to identify your daily goals for nutrients listed on the label. Shopping  Avoid buying canned, premade, or processed foods. These foods tend to be high in fat, sodium, and added sugar.  Shop around the outside edge of the grocery store. This includes fresh fruits and vegetables, bulk grains, fresh meats, and fresh dairy. Cooking  Use low-heat cooking methods, such as baking, instead of high-heat cooking methods like deep frying.  Cook using healthy oils, such as olive, canola, or sunflower oil.  Avoid cooking with butter, cream, or high-fat meats. Meal planning  Eat meals and snacks regularly, preferably at the same times every day. Avoid going long periods of time without eating.  Eat foods high in fiber, such as fresh fruits, vegetables, beans, and whole grains. Talk to your dietitian about how many servings of carbs you can eat at each meal.  Eat 4-6 ounces (oz) of lean protein each day, such as lean meat, chicken, fish, eggs, or tofu. One oz of lean protein is equal to: ? 1 oz of meat, chicken, or fish. ? 1 egg. ?  cup of tofu.  Eat some foods each day that contain  healthy fats, such as avocado, nuts, seeds, and fish. Lifestyle  Check your blood glucose regularly.  Exercise regularly as told by your health care provider. This may include: ? 150 minutes of moderate-intensity or vigorous-intensity exercise each week. This could be brisk walking, biking, or water aerobics. ? Stretching and doing strength exercises, such as yoga or weightlifting, at least 2 times a week.  Take medicines as told by your health care provider.  Do not use any products that contain nicotine or tobacco, such as cigarettes and e-cigarettes. If you need help quitting, ask your health care provider.  Work with a counselor or diabetes educator to identify strategies to manage stress and any emotional and social challenges. Questions to ask a health care provider  Do I need to meet with a diabetes educator?  Do I need to meet with a dietitian?  What number can I call if I have questions?  When are the best times to   check my blood glucose? Where to find more information:  American Diabetes Association: diabetes.org  Academy of Nutrition and Dietetics: www.eatright.org  National Institute of Diabetes and Digestive and Kidney Diseases (NIH): www.niddk.nih.gov Summary  A healthy meal plan will help you control your blood glucose and maintain a healthy lifestyle.  Working with a diet and nutrition specialist (dietitian) can help you make a meal plan that is best for you.  Keep in mind that carbohydrates (carbs) and alcohol have immediate effects on your blood glucose levels. It is important to count carbs and to use alcohol carefully. This information is not intended to replace advice given to you by your health care provider. Make sure you discuss any questions you have with your health care provider. Document Released: 05/21/2005 Document Revised: 03/24/2017 Document Reviewed: 09/28/2016 Elsevier Interactive Patient Education  2019 [employer]  

## 2018-11-03 ENCOUNTER — Encounter: Payer: Self-pay | Admitting: Emergency Medicine

## 2019-05-03 ENCOUNTER — Other Ambulatory Visit: Payer: Self-pay

## 2019-05-03 ENCOUNTER — Ambulatory Visit (INDEPENDENT_AMBULATORY_CARE_PROVIDER_SITE_OTHER): Payer: BC Managed Care – PPO | Admitting: Emergency Medicine

## 2019-05-03 ENCOUNTER — Encounter: Payer: Self-pay | Admitting: Emergency Medicine

## 2019-05-03 VITALS — BP 112/73 | HR 85 | Temp 98.9°F | Resp 16 | Ht 63.0 in | Wt 181.2 lb

## 2019-05-03 DIAGNOSIS — Z20822 Contact with and (suspected) exposure to covid-19: Secondary | ICD-10-CM

## 2019-05-03 DIAGNOSIS — E119 Type 2 diabetes mellitus without complications: Secondary | ICD-10-CM | POA: Diagnosis not present

## 2019-05-03 DIAGNOSIS — Z20828 Contact with and (suspected) exposure to other viral communicable diseases: Secondary | ICD-10-CM | POA: Diagnosis not present

## 2019-05-03 DIAGNOSIS — E785 Hyperlipidemia, unspecified: Secondary | ICD-10-CM

## 2019-05-03 DIAGNOSIS — Z23 Encounter for immunization: Secondary | ICD-10-CM | POA: Diagnosis not present

## 2019-05-03 LAB — POCT GLYCOSYLATED HEMOGLOBIN (HGB A1C): Hemoglobin A1C: 6.3 % — AB (ref 4.0–5.6)

## 2019-05-03 NOTE — Patient Instructions (Addendum)
   If you have lab work done today you will be contacted with your lab results within the next 2 weeks.  If you have not heard from us then please contact us. The fastest way to get your results is to register for My Chart.   IF you received an x-ray today, you will receive an invoice from [facility]. Please contact [facility] at [phone] with questions or concerns regarding your invoice.   IF you received labwork today, you will receive an invoice from [facility]. Please contact [facility] at [phone] with questions or concerns regarding your invoice.   Our billing staff will not be able to assist you with questions regarding bills from these companies.  You will be contacted with the lab results as soon as they are available. The fastest way to get your results is to activate your My Chart account. Instructions are located on the last page of this paperwork. If you have not heard from us regarding the results in 2 weeks, please contact this office.     Diabetes Mellitus and Nutrition, Adult When you have diabetes (diabetes mellitus), it is very important to have healthy eating habits because your blood sugar (glucose) levels are greatly affected by what you eat and drink. Eating healthy foods in the appropriate amounts, at about the same times every day, can help you:  Control your blood glucose.  Lower your risk of heart disease.  Improve your blood pressure.  Reach or maintain a healthy weight. Every person with diabetes is different, and each person has different needs for a meal plan. Your health care provider may recommend that you work with a diet and nutrition specialist (dietitian) to make a meal plan that is best for you. Your meal plan may vary depending on factors such as:  The calories you need.  The medicines you take.  Your weight.  Your blood glucose, blood pressure, and cholesterol levels.  Your activity level.  Other health conditions  you have, such as heart or kidney disease. How do carbohydrates affect me? Carbohydrates, also called carbs, affect your blood glucose level more than any other type of food. Eating carbs naturally raises the amount of glucose in your blood. Carb counting is a method for keeping track of how many carbs you eat. Counting carbs is important to keep your blood glucose at a healthy level, especially if you use insulin or take certain oral diabetes medicines. It is important to know how many carbs you can safely have in each meal. This is different for every person. Your dietitian can help you calculate how many carbs you should have at each meal and for each snack. Foods that contain carbs include:  Bread, cereal, rice, pasta, and crackers.  Potatoes and corn.  Peas, beans, and lentils.  Milk and yogurt.  Fruit and juice.  Desserts, such as cakes, cookies, ice cream, and candy. How does alcohol affect me? Alcohol can cause a sudden decrease in blood glucose (hypoglycemia), especially if you use insulin or take certain oral diabetes medicines. Hypoglycemia can be a life-threatening condition. Symptoms of hypoglycemia (sleepiness, dizziness, and confusion) are similar to symptoms of having too much alcohol. If your health care provider says that alcohol is safe for you, follow these guidelines:  Limit alcohol intake to no more than 1 drink per day for nonpregnant women and 2 drinks per day for men. One drink equals 12 oz of beer, 5 oz of wine, or 1 oz of hard liquor.    Do not drink on an empty stomach.  Keep yourself hydrated with water, diet soda, or unsweetened iced tea.  Keep in mind that regular soda, juice, and other mixers may contain a lot of sugar and must be counted as carbs. What are tips for following this plan?  Reading food labels  Start by checking the serving size on the "Nutrition Facts" label of packaged foods and drinks. The amount of calories, carbs, fats, and other  nutrients listed on the label is based on one serving of the item. Many items contain more than one serving per package.  Check the total grams (g) of carbs in one serving. You can calculate the number of servings of carbs in one serving by dividing the total carbs by 15. For example, if a food has 30 g of total carbs, it would be equal to 2 servings of carbs.  Check the number of grams (g) of saturated and trans fats in one serving. Choose foods that have low or no amount of these fats.  Check the number of milligrams (mg) of salt (sodium) in one serving. Most people should limit total sodium intake to less than 2,300 mg per day.  Always check the nutrition information of foods labeled as "low-fat" or "nonfat". These foods may be higher in added sugar or refined carbs and should be avoided.  Talk to your dietitian to identify your daily goals for nutrients listed on the label. Shopping  Avoid buying canned, premade, or processed foods. These foods tend to be high in fat, sodium, and added sugar.  Shop around the outside edge of the grocery store. This includes fresh fruits and vegetables, bulk grains, fresh meats, and fresh dairy. Cooking  Use low-heat cooking methods, such as baking, instead of high-heat cooking methods like deep frying.  Cook using healthy oils, such as olive, canola, or sunflower oil.  Avoid cooking with butter, cream, or high-fat meats. Meal planning  Eat meals and snacks regularly, preferably at the same times every day. Avoid going long periods of time without eating.  Eat foods high in fiber, such as fresh fruits, vegetables, beans, and whole grains. Talk to your dietitian about how many servings of carbs you can eat at each meal.  Eat 4-6 ounces (oz) of lean protein each day, such as lean meat, chicken, fish, eggs, or tofu. One oz of lean protein is equal to: ? 1 oz of meat, chicken, or fish. ? 1 egg. ?  cup of tofu.  Eat some foods each day that contain  healthy fats, such as avocado, nuts, seeds, and fish. Lifestyle  Check your blood glucose regularly.  Exercise regularly as told by your health care provider. This may include: ? 150 minutes of moderate-intensity or vigorous-intensity exercise each week. This could be brisk walking, biking, or water aerobics. ? Stretching and doing strength exercises, such as yoga or weightlifting, at least 2 times a week.  Take medicines as told by your health care provider.  Do not use any products that contain nicotine or tobacco, such as cigarettes and e-cigarettes. If you need help quitting, ask your health care provider.  Work with a counselor or diabetes educator to identify strategies to manage stress and any emotional and social challenges. Questions to ask a health care provider  Do I need to meet with a diabetes educator?  Do I need to meet with a dietitian?  What number can I call if I have questions?  When are the best times to   check my blood glucose? Where to find more information:  American Diabetes Association: diabetes.org  Academy of Nutrition and Dietetics: www.eatright.org  National Institute of Diabetes and Digestive and Kidney Diseases (NIH): www.niddk.nih.gov Summary  A healthy meal plan will help you control your blood glucose and maintain a healthy lifestyle.  Working with a diet and nutrition specialist (dietitian) can help you make a meal plan that is best for you.  Keep in mind that carbohydrates (carbs) and alcohol have immediate effects on your blood glucose levels. It is important to count carbs and to use alcohol carefully. This information is not intended to replace advice given to you by your health care provider. Make sure you discuss any questions you have with your health care provider. Document Released: 05/21/2005 Document Revised: 08/06/2017 Document Reviewed: 09/28/2016 Elsevier Patient Education  2020 [employer]  

## 2019-05-03 NOTE — Progress Notes (Signed)
Lab Results  Component Value Date   HGBA1C 6.1 (A) 11/02/2018   Lab Results  Component Value Date   CHOL 141 11/02/2018   HDL 62 11/02/2018   LDLCALC 67 11/02/2018   TRIG 62 11/02/2018   CHOLHDL 2.3 11/02/2018   BP Readings from Last 3 Encounters:  05/03/19 112/73  11/02/18 99/65  07/27/18 106/72   Wt Readings from Last 3 Encounters:  05/03/19 181 lb 3.2 oz (82.2 kg)  11/02/18 178 lb 3.2 oz (80.8 kg)  07/27/18 179 lb 6.4 oz (81.4 kg)   Kerri Sims 48 y.o.   Chief Complaint  Patient presents with  . Diabetes    follow up 6 months    HISTORY OF PRESENT ILLNESS: This is a 48 y.o. female with history of diabetes and dyslipidemia here for follow-up. Has no complaints or medical concerns. 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  Asymptomatic but has been exposed in the hospital setting.  HPI   Prior to Admission medications   Medication Sig Start Date End Date Taking? Authorizing Provider  metFORMIN (GLUCOPHAGE) 1000 MG tablet Take 1 tablet (1,000 mg total) by mouth 2 (two) times daily with a meal. 07/27/18  Yes Otis Portal, Ines Bloomer, MD  rosuvastatin (CRESTOR) 10 MG tablet Take 1 tablet (10 mg total) by mouth daily. 07/27/18  Yes Latalia Etzler, Ines Bloomer, MD  blood glucose meter kit and supplies KIT Dispense based on patient and insurance preference. Use up to four times daily as directed. (FOR ICD-9 250.00, 250.01). 07/21/17   Horald Pollen, MD  glucose blood (CONTOUR NEXT TEST) test strip CHECK GLUCOSE 4 TIMES DAILY 07/27/18   Horald Pollen, MD  Iron-FA-B Cmp-C-Biot-Probiotic (FUSION PLUS) CAPS Take 1 capsule every morning by mouth. Patient not taking: Reported on 11/02/2018 07/19/17   Horald Pollen, MD    Allergies  Allergen Reactions  . Percocet [Oxycodone-Acetaminophen] Itching    Patient Active Problem List   Diagnosis Date Noted  . Dyslipidemia 07/27/2018  . Type 2 diabetes mellitus without  complication, without long-term current use of insulin ([place]) 12/10/2017    Past Medical History:  Diagnosis Date  . Medical history non-contributory   . PONV (postoperative nausea and vomiting)     Past Surgical History:  Procedure Laterality Date  . ABDOMINAL HYSTERECTOMY N/A 03/31/2013   Procedure: HYSTERECTOMY ABDOMINAL;  Surgeon: Lahoma Crocker, MD;  Location: [facility];  Service: Gynecology;  Laterality: N/A;  fibroids  . BILATERAL SALPINGECTOMY Bilateral 03/31/2013   Procedure: BILATERAL SALPINGECTOMY;  Surgeon: Lahoma Crocker, MD;  Location: [facility];  Service: Gynecology;  Laterality: Bilateral;  . CESAREAN SECTION      Social History   Socioeconomic History  . Marital status: Married    Spouse name: Not on file  . Number of children: Not on file  . Years of education: Not on file  . Highest education level: Not on file  Occupational History  . Not on file  Social Needs  . Financial resource strain: Not on file  . Food insecurity    Worry: Not on file    Inability: Not on file  . Transportation needs    Medical: Not on file    Non-medical: Not on file  Tobacco Use  . Smoking status: Never Smoker  . Smokeless tobacco: Never Used  Substance and Sexual Activity  . Alcohol use: No  . Drug use: No  . Sexual activity: Not on file  Lifestyle  . Physical activity  Days per week: Not on file    Minutes per session: Not on file  . Stress: Not on file  Relationships  . Social Herbalist on phone: Not on file    Gets together: Not on file    Attends religious service: Not on file    Active member of club or organization: Not on file    Attends meetings of clubs or organizations: Not on file    Relationship status: Not on file  . Intimate partner violence    Fear of current or ex partner: Not on file    Emotionally abused: Not on file    Physically abused: Not on file    Forced sexual activity: Not on file  Other Topics Concern  . Not on file   Social History Narrative  . Not on file    Family History  Problem Relation Age of Onset  . Hyperlipidemia Mother   . Hypertension Mother   . Stroke Father      Review of Systems  Constitutional: Negative.  Negative for chills and fever.  HENT: Negative.  Negative for congestion and sore throat.   Eyes: Negative.   Respiratory: Negative.  Negative for cough and shortness of breath.   Cardiovascular: Negative.  Negative for chest pain and palpitations.  Gastrointestinal: Negative.  Negative for abdominal pain, diarrhea, nausea and vomiting.  Genitourinary: Negative.   Musculoskeletal: Negative.   Skin: Negative.   Neurological: Negative for dizziness and headaches.  All other systems reviewed and are negative.  Vitals:   05/03/19 1147  BP: 112/73  Pulse: 85  Resp: 16  Temp: 98.9 F (37.2 C)  SpO2: 96%     Physical Exam Vitals signs reviewed.  Constitutional:      Appearance: Normal appearance.  HENT:     Head: Normocephalic.  Eyes:     Extraocular Movements: Extraocular movements intact.     Pupils: Pupils are equal, round, and reactive to light.  Neck:     Musculoskeletal: Normal range of motion and neck supple.  Cardiovascular:     Rate and Rhythm: Normal rate and regular rhythm.     Heart sounds: Normal heart sounds.  Pulmonary:     Effort: Pulmonary effort is normal.     Breath sounds: Normal breath sounds.  Musculoskeletal: Normal range of motion.  Skin:    General: Skin is warm and dry.     Capillary Refill: Capillary refill takes less than 2 seconds.  Neurological:     General: No focal deficit present.     Mental Status: She is alert and oriented to person, place, and time.  Psychiatric:        Mood and Affect: Mood normal.        Behavior: Behavior normal.    A total of 25 minutes was spent in the room with the patient, greater than 50% of which was in counseling/coordination of care regarding diabetes and dyslipidemia, cardiovascular risk  associated with it, diet and nutrition, medication and side effects, prognosis and need for follow-up.   ASSESSMENT & PLAN: Kerri Sims was seen today for diabetes.  Diagnoses and all orders for this visit:  Type 2 diabetes mellitus without complication, without long-term current use of insulin (HCC) -     CBC with Differential/Platelet -     Comprehensive metabolic panel -     Hemoglobin A1c  Dyslipidemia -     CBC with Differential/Platelet -     Lipid panel  Exposure  to Covid-19 Virus -     Novel Coronavirus, NAA ([facility])     Patient Instructions       If you have lab work done today you will be contacted with your lab results within the next 2 weeks.  If you have not heard from [place] then please contact us. The fastest way to get your results is to register for My Chart.   IF you received an x-ray today, you will receive an invoice from [facility]. Please contact [facility] at [phone] with questions or concerns regarding your invoice.   IF you received labwork today, you will receive an invoice from [place]. Please contact [facility] at [phone] with questions or concerns regarding your invoice.   Our billing staff will not be able to assist you with questions regarding bills from these companies.  You will be contacted with the lab results as soon as they are available. The fastest way to get your results is to activate your My Chart account. Instructions are located on the last page of this paperwork. If you have not heard from [place] regarding the results in 2 weeks, please contact this office.     Diabetes Mellitus and Nutrition, Adult When you have diabetes (diabetes mellitus), it is very important to have healthy eating habits because your blood sugar (glucose) levels are greatly affected by what you eat and drink. Eating healthy foods in the appropriate amounts, at about the same times every day, can help you:  Control your blood glucose.   Lower your risk of heart disease.  Improve your blood pressure.  Reach or maintain a healthy weight. Every person with diabetes is different, and each person has different needs for a meal plan. Your health care provider may recommend that you work with a diet and nutrition specialist (dietitian) to make a meal plan that is best for you. Your meal plan may vary depending on factors such as:  The calories you need.  The medicines you take.  Your weight.  Your blood glucose, blood pressure, and cholesterol levels.  Your activity level.  Other health conditions you have, such as heart or kidney disease. How do carbohydrates affect me? Carbohydrates, also called carbs, affect your blood glucose level more than any other type of food. Eating carbs naturally raises the amount of glucose in your blood. Carb counting is a method for keeping track of how many carbs you eat. Counting carbs is important to keep your blood glucose at a healthy level, especially if you use insulin or take certain oral diabetes medicines. It is important to know how many carbs you can safely have in each meal. This is different for every person. Your dietitian can help you calculate how many carbs you should have at each meal and for each snack. Foods that contain carbs include:  Bread, cereal, rice, pasta, and crackers.  Potatoes and corn.  Peas, beans, and lentils.  Milk and yogurt.  Fruit and juice.  Desserts, such as cakes, cookies, ice cream, and candy. How does alcohol affect me? Alcohol can cause a sudden decrease in blood glucose (hypoglycemia), especially if you use insulin or take certain oral diabetes medicines. Hypoglycemia can be a life-threatening condition. Symptoms of hypoglycemia (sleepiness, dizziness, and confusion) are similar to symptoms of having too much alcohol. If your health care provider says that alcohol is safe for you, follow these guidelines:  Limit alcohol intake to no more  than 1 drink per day for nonpregnant women and 2  drinks per day for men. One drink equals 12 oz of beer, 5 oz of wine, or 1 oz of hard liquor.  Do not drink on an empty stomach.  Keep yourself hydrated with water, diet soda, or unsweetened iced tea.  Keep in mind that regular soda, juice, and other mixers may contain a lot of sugar and must be counted as carbs. What are tips for following this plan?  Reading food labels  Start by checking the serving size on the "Nutrition Facts" label of packaged foods and drinks. The amount of calories, carbs, fats, and other nutrients listed on the label is based on one serving of the item. Many items contain more than one serving per package.  Check the total grams (g) of carbs in one serving. You can calculate the number of servings of carbs in one serving by dividing the total carbs by 15. For example, if a food has 30 g of total carbs, it would be equal to 2 servings of carbs.  Check the number of grams (g) of saturated and trans fats in one serving. Choose foods that have low or no amount of these fats.  Check the number of milligrams (mg) of salt (sodium) in one serving. Most people should limit total sodium intake to less than 2,300 mg per day.  Always check the nutrition information of foods labeled as "low-fat" or "nonfat". These foods may be higher in added sugar or refined carbs and should be avoided.  Talk to your dietitian to identify your daily goals for nutrients listed on the label. Shopping  Avoid buying canned, premade, or processed foods. These foods tend to be high in fat, sodium, and added sugar.  Shop around the outside edge of the grocery store. This includes fresh fruits and vegetables, bulk grains, fresh meats, and fresh dairy. Cooking  Use low-heat cooking methods, such as baking, instead of high-heat cooking methods like deep frying.  Cook using healthy oils, such as olive, canola, or sunflower oil.  Avoid cooking with  butter, cream, or high-fat meats. Meal planning  Eat meals and snacks regularly, preferably at the same times every day. Avoid going long periods of time without eating.  Eat foods high in fiber, such as fresh fruits, vegetables, beans, and whole grains. Talk to your dietitian about how many servings of carbs you can eat at each meal.  Eat 4-6 ounces (oz) of lean protein each day, such as lean meat, chicken, fish, eggs, or tofu. One oz of lean protein is equal to: ? 1 oz of meat, chicken, or fish. ? 1 egg. ?  cup of tofu.  Eat some foods each day that contain healthy fats, such as avocado, nuts, seeds, and fish. Lifestyle  Check your blood glucose regularly.  Exercise regularly as told by your health care provider. This may include: ? 150 minutes of moderate-intensity or vigorous-intensity exercise each week. This could be brisk walking, biking, or water aerobics. ? Stretching and doing strength exercises, such as yoga or weightlifting, at least 2 times a week.  Take medicines as told by your health care provider.  Do not use any products that contain nicotine or tobacco, such as cigarettes and e-cigarettes. If you need help quitting, ask your health care provider.  Work with a Social worker or diabetes educator to identify strategies to manage stress and any emotional and social challenges. Questions to ask a health care provider  Do I need to meet with a diabetes educator?  Do I need  to meet with a dietitian?  What number can I call if I have questions?  When are the best times to check my blood glucose? Where to find more information:  American Diabetes Association: diabetes.org  Academy of Nutrition and Dietetics: www.eatright.[employer] of Diabetes and Digestive and Kidney Diseases (NIH): [employer] Summary  A healthy meal plan will help you control your blood glucose and maintain a healthy lifestyle.  Working with a diet and nutrition specialist  (dietitian) can help you make a meal plan that is best for you.  Keep in mind that carbohydrates (carbs) and alcohol have immediate effects on your blood glucose levels. It is important to count carbs and to use alcohol carefully. This information is not intended to replace advice given to you by your health care provider. Make sure you discuss any questions you have with your health care provider. Document Released: 05/21/2005 Document Revised: 08/06/2017 Document Reviewed: 09/28/2016 Elsevier Patient Education  2020 Elsevier Inc.     Agustina Caroli, MD [facility]

## 2019-05-04 ENCOUNTER — Encounter: Payer: Self-pay | Admitting: Emergency Medicine

## 2019-05-04 LAB — CBC WITH DIFFERENTIAL/PLATELET
Basophils Absolute: 0 10*3/uL (ref 0.0–0.2)
Basos: 1 %
EOS (ABSOLUTE): 0.2 10*3/uL (ref 0.0–0.4)
Eos: 4 %
Hematocrit: 37 % (ref 34.0–46.6)
Hemoglobin: 12.6 g/dL (ref 11.1–15.9)
Immature Grans (Abs): 0 10*3/uL (ref 0.0–0.1)
Immature Granulocytes: 0 %
Lymphocytes Absolute: 2.2 10*3/uL (ref 0.7–3.1)
Lymphs: 52 %
MCH: 30.6 pg (ref 26.6–33.0)
MCHC: 34.1 g/dL (ref 31.5–35.7)
MCV: 90 fL (ref 79–97)
Monocytes Absolute: 0.4 10*3/uL (ref 0.1–0.9)
Monocytes: 10 %
Neutrophils Absolute: 1.4 10*3/uL (ref 1.4–7.0)
Neutrophils: 33 %
Platelets: 312 10*3/uL (ref 150–450)
RBC: 4.12 x10E6/uL (ref 3.77–5.28)
RDW: 12.6 % (ref 11.7–15.4)
WBC: 4.3 10*3/uL (ref 3.4–10.8)

## 2019-05-04 LAB — COMPREHENSIVE METABOLIC PANEL
ALT: 9 IU/L (ref 0–32)
AST: 14 IU/L (ref 0–40)
Albumin/Globulin Ratio: 1.8 (ref 1.2–2.2)
Albumin: 4.5 g/dL (ref 3.8–4.8)
Alkaline Phosphatase: 44 IU/L (ref 39–117)
BUN/Creatinine Ratio: 10 (ref 9–23)
BUN: 7 mg/dL (ref 6–24)
Bilirubin Total: 0.3 mg/dL (ref 0.0–1.2)
CO2: 23 mmol/L (ref 20–29)
Calcium: 9.2 mg/dL (ref 8.7–10.2)
Chloride: 104 mmol/L (ref 96–106)
Creatinine, Ser: 0.68 mg/dL (ref 0.57–1.00)
GFR calc Af Amer: 119 mL/min/{1.73_m2} (ref >59)
GFR calc non Af Amer: 103 mL/min/{1.73_m2} (ref >59)
Globulin, Total: 2.5 g/dL (ref 1.5–4.5)
Glucose: 101 mg/dL — ABNORMAL HIGH (ref 65–99)
Potassium: 4.3 mmol/L (ref 3.5–5.2)
Sodium: 141 mmol/L (ref 134–144)
Total Protein: 7 g/dL (ref 6.0–8.5)

## 2019-05-04 LAB — LIPID PANEL
Chol/HDL Ratio: 2.1 ratio (ref 0.0–4.4)
Cholesterol, Total: 130 mg/dL (ref 100–199)
HDL: 62 mg/dL (ref >39)
LDL Calculated: 56 mg/dL (ref 0–99)
Triglycerides: 58 mg/dL (ref 0–149)
VLDL Cholesterol Cal: 12 mg/dL (ref 5–40)

## 2019-05-04 LAB — NOVEL CORONAVIRUS, NAA: SARS-CoV-2, NAA: NOT DETECTED

## 2019-05-04 LAB — HEMOGLOBIN A1C
Est. average glucose Bld gHb Est-mCnc: 131 mg/dL
Hgb A1c MFr Bld: 6.2 % — ABNORMAL HIGH (ref 4.8–5.6)

## 2019-05-04 LAB — SPECIMEN STATUS REPORT

## 2019-08-17 DIAGNOSIS — Z23 Encounter for immunization: Secondary | ICD-10-CM | POA: Diagnosis not present

## 2019-08-24 ENCOUNTER — Telehealth: Payer: Self-pay | Admitting: Emergency Medicine

## 2019-08-24 DIAGNOSIS — E119 Type 2 diabetes mellitus without complications: Secondary | ICD-10-CM

## 2019-08-29 ENCOUNTER — Other Ambulatory Visit: Payer: Self-pay | Admitting: Emergency Medicine

## 2019-08-29 DIAGNOSIS — E119 Type 2 diabetes mellitus without complications: Secondary | ICD-10-CM

## 2019-08-29 NOTE — Telephone Encounter (Signed)
Pt stated her pharmacy submitted a list of  glucose monitors that her insurance company will cover so she needs the Rx to resubmitted with 1 of the approved glucose monitors. Pt requests call back

## 2019-09-05 ENCOUNTER — Other Ambulatory Visit: Payer: Self-pay | Admitting: Emergency Medicine

## 2019-09-05 DIAGNOSIS — E119 Type 2 diabetes mellitus without complications: Secondary | ICD-10-CM

## 2019-09-05 NOTE — Telephone Encounter (Signed)
Requested medication (s) are due for refill today: Yes  Requested medication (s) are on the active medication list: Yes  Last refill:  07/27/18  Future visit scheduled: Yes  Notes to clinic:  Prescription has expired    Requested Prescriptions  Pending Prescriptions Disp Refills   rosuvastatin (CRESTOR) 10 MG tablet [Pharmacy Med Name: Rosuvastatin Calcium 10 MG Oral Tablet] 60 tablet 0    Sig: Take 1 tablet by mouth once daily      Cardiovascular:  Antilipid - Statins Passed - 09/05/2019  9:49 AM      Passed - Total Cholesterol in normal range and within 360 days    Cholesterol, Total  Date Value Ref Range Status  05/03/2019 130 100 - 199 mg/dL Final          Passed - LDL in normal range and within 360 days    LDL Calculated  Date Value Ref Range Status  05/03/2019 56 0 - 99 mg/dL Final    Comment:    **Effective May 08, 2019, [facility] is implementing an improved** equation to calculate Low Density Lipoprotein Cholesterol (LDL-C) concentrations, to be used in all lipid panels that report calculated LDL-C. This equation was developed through a collaboration with the [employer], Lung and Experiment (NIH).[1] The NIH calculation overcomes the limitations of the existing [place] LDL-C equation and performs equally well in both fasting and non-fasting individuals. 1. Pauline Good Q, et al. A new equation for calculation of low-density lipoprotein cholesterol in patients with normolipidemia and/or hypertriglyceridemia. JAMA Cardiol. 2020 Feb 26. doi:10.1001/jamacardio.2020.0013           Passed - HDL in normal range and within 360 days    HDL  Date Value Ref Range Status  05/03/2019 62 >39 mg/dL Final          Passed - Triglycerides in normal range and within 360 days    Triglycerides  Date Value Ref Range Status  05/03/2019 58 0 - 149 mg/dL Final          Passed - Patient is not pregnant      Passed - Valid encounter within  last 12 months    Recent Outpatient Visits           4 months ago Type 2 diabetes mellitus without complication, without long-term current use of insulin ([place])   [facility] at [place], [place], MD   10 months ago Type 2 diabetes mellitus without complication, without long-term current use of insulin [facility])   Primary Care at [place], [place], MD   1 year ago Type 2 diabetes mellitus without complication, without long-term current use of insulin [facility])   Primary Care at [place], [place], MD   1 year ago Type 2 diabetes mellitus without complication, without long-term current use of insulin [facility]   Primary Care at [place], Ines Bloomer, MD   2 years ago Routine general medical examination at a health care facility   [facility] at [facility], Ines Bloomer, MD       Future Appointments             In 1 month [place], Ines Bloomer, MD Primary [facility] at [place], [facility]

## 2019-10-12 ENCOUNTER — Other Ambulatory Visit: Payer: Self-pay | Admitting: Emergency Medicine

## 2019-10-12 DIAGNOSIS — E119 Type 2 diabetes mellitus without complications: Secondary | ICD-10-CM

## 2019-10-30 ENCOUNTER — Telehealth: Payer: Self-pay | Admitting: Emergency Medicine

## 2019-10-30 NOTE — Telephone Encounter (Signed)
Patient is calling to reschedule appt on Wednesday. Please advise Cb- [phone]

## 2019-10-31 NOTE — Telephone Encounter (Signed)
Attempted to call the patient LVM to call back so we could get her appointment rescheduled.

## 2019-11-01 ENCOUNTER — Ambulatory Visit: Payer: BC Managed Care – PPO | Admitting: Emergency Medicine

## 2020-02-17 LAB — HM MAMMOGRAPHY

## 2020-02-27 ENCOUNTER — Encounter: Payer: Self-pay | Admitting: *Deleted

## 2020-04-16 ENCOUNTER — Telehealth: Payer: Self-pay | Admitting: *Deleted

## 2020-04-16 ENCOUNTER — Ambulatory Visit (INDEPENDENT_AMBULATORY_CARE_PROVIDER_SITE_OTHER): Payer: Commercial Managed Care - PPO | Admitting: Emergency Medicine

## 2020-04-16 ENCOUNTER — Encounter: Payer: Self-pay | Admitting: Emergency Medicine

## 2020-04-16 ENCOUNTER — Other Ambulatory Visit: Payer: Self-pay

## 2020-04-16 VITALS — BP 117/72 | HR 75 | Temp 97.9°F | Ht 62.0 in | Wt 193.4 lb

## 2020-04-16 DIAGNOSIS — E1169 Type 2 diabetes mellitus with other specified complication: Secondary | ICD-10-CM

## 2020-04-16 DIAGNOSIS — E785 Hyperlipidemia, unspecified: Secondary | ICD-10-CM

## 2020-04-16 DIAGNOSIS — Z6835 Body mass index (BMI) 35.0-35.9, adult: Secondary | ICD-10-CM

## 2020-04-16 LAB — COMPREHENSIVE METABOLIC PANEL
ALT: 10 IU/L (ref 0–32)
AST: 13 IU/L (ref 0–40)
Albumin/Globulin Ratio: 1.6 (ref 1.2–2.2)
Albumin: 4.6 g/dL (ref 3.8–4.8)
Alkaline Phosphatase: 65 IU/L (ref 48–121)
BUN/Creatinine Ratio: 12 (ref 9–23)
BUN: 8 mg/dL (ref 6–24)
Bilirubin Total: 0.3 mg/dL (ref 0.0–1.2)
CO2: 22 mmol/L (ref 20–29)
Calcium: 10.1 mg/dL (ref 8.7–10.2)
Chloride: 102 mmol/L (ref 96–106)
Creatinine, Ser: 0.68 mg/dL (ref 0.57–1.00)
GFR calc Af Amer: 118 mL/min/{1.73_m2} (ref >59)
GFR calc non Af Amer: 102 mL/min/{1.73_m2} (ref >59)
Globulin, Total: 2.8 g/dL (ref 1.5–4.5)
Glucose: 149 mg/dL — ABNORMAL HIGH (ref 65–99)
Potassium: 4.1 mmol/L (ref 3.5–5.2)
Sodium: 139 mmol/L (ref 134–144)
Total Protein: 7.4 g/dL (ref 6.0–8.5)

## 2020-04-16 LAB — POCT GLYCOSYLATED HEMOGLOBIN (HGB A1C): Hemoglobin A1C: 7.8 % — AB (ref 4.0–5.6)

## 2020-04-16 MED ORDER — BLOOD GLUCOSE MONITOR KIT: PACK | 0 refills | Status: DC

## 2020-04-16 MED ORDER — ROSUVASTATIN CALCIUM 10 MG PO TABS: 10.0000 mg | ORAL_TABLET | Freq: Every day | ORAL | 3 refills | Status: DC

## 2020-04-16 MED ORDER — CONTOUR NEXT MONITOR W/DEVICE KIT: PACK | 0 refills | Status: AC

## 2020-04-16 MED ORDER — CONTOUR NEXT TEST VI STRP: ORAL_STRIP | 0 refills | Status: DC

## 2020-04-16 MED ORDER — METFORMIN HCL 1000 MG PO TABS: ORAL_TABLET | ORAL | 3 refills | Status: DC

## 2020-04-16 NOTE — Assessment & Plan Note (Addendum)
Uncontrolled diabetes with hemoglobin A1c of 7.8.  Patient has been noncompliant with Metformin and diet.  Continue Metformin as prescribed and rosuvastatin.  Diet and nutrition discussed.  Follow-up in 3 months.

## 2020-04-16 NOTE — Patient Instructions (Addendum)
   If you have lab work done today you will be contacted with your lab results within the next 2 weeks.  If you have not heard from us then please contact us. The fastest way to get your results is to register for My Chart.   IF you received an x-ray today, you will receive an invoice from [facility]. Please contact  Radiology at [phone] with questions or concerns regarding your invoice.   IF you received labwork today, you will receive an invoice from [facility]. Please contact [facility] at [phone] with questions or concerns regarding your invoice.   Our billing staff will not be able to assist you with questions regarding bills from these companies.  You will be contacted with the lab results as soon as they are available. The fastest way to get your results is to activate your My Chart account. Instructions are located on the last page of this paperwork. If you have not heard from us regarding the results in 2 weeks, please contact this office.     Diabetes Mellitus and Nutrition, Adult When you have diabetes (diabetes mellitus), it is very important to have healthy eating habits because your blood sugar (glucose) levels are greatly affected by what you eat and drink. Eating healthy foods in the appropriate amounts, at about the same times every day, can help you:  Control your blood glucose.  Lower your risk of heart disease.  Improve your blood pressure.  Reach or maintain a healthy weight. Every person with diabetes is different, and each person has different needs for a meal plan. Your health care provider may recommend that you work with a diet and nutrition specialist (dietitian) to make a meal plan that is best for you. Your meal plan may vary depending on factors such as:  The calories you need.  The medicines you take.  Your weight.  Your blood glucose, blood pressure, and cholesterol levels.  Your activity level.  Other health conditions  you have, such as heart or kidney disease. How do carbohydrates affect me? Carbohydrates, also called carbs, affect your blood glucose level more than any other type of food. Eating carbs naturally raises the amount of glucose in your blood. Carb counting is a method for keeping track of how many carbs you eat. Counting carbs is important to keep your blood glucose at a healthy level, especially if you use insulin or take certain oral diabetes medicines. It is important to know how many carbs you can safely have in each meal. This is different for every person. Your dietitian can help you calculate how many carbs you should have at each meal and for each snack. Foods that contain carbs include:  Bread, cereal, rice, pasta, and crackers.  Potatoes and corn.  Peas, beans, and lentils.  Milk and yogurt.  Fruit and juice.  Desserts, such as cakes, cookies, ice cream, and candy. How does alcohol affect me? Alcohol can cause a sudden decrease in blood glucose (hypoglycemia), especially if you use insulin or take certain oral diabetes medicines. Hypoglycemia can be a life-threatening condition. Symptoms of hypoglycemia (sleepiness, dizziness, and confusion) are similar to symptoms of having too much alcohol. If your health care provider says that alcohol is safe for you, follow these guidelines:  Limit alcohol intake to no more than 1 drink per day for nonpregnant women and 2 drinks per day for men. One drink equals 12 oz of beer, 5 oz of wine, or 1 oz of hard liquor.    Do not drink on an empty stomach.  Keep yourself hydrated with water, diet soda, or unsweetened iced tea.  Keep in mind that regular soda, juice, and other mixers may contain a lot of sugar and must be counted as carbs. What are tips for following this plan?  Reading food labels  Start by checking the serving size on the "Nutrition Facts" label of packaged foods and drinks. The amount of calories, carbs, fats, and other  nutrients listed on the label is based on one serving of the item. Many items contain more than one serving per package.  Check the total grams (g) of carbs in one serving. You can calculate the number of servings of carbs in one serving by dividing the total carbs by 15. For example, if a food has 30 g of total carbs, it would be equal to 2 servings of carbs.  Check the number of grams (g) of saturated and trans fats in one serving. Choose foods that have low or no amount of these fats.  Check the number of milligrams (mg) of salt (sodium) in one serving. Most people should limit total sodium intake to less than 2,300 mg per day.  Always check the nutrition information of foods labeled as "low-fat" or "nonfat". These foods may be higher in added sugar or refined carbs and should be avoided.  Talk to your dietitian to identify your daily goals for nutrients listed on the label. Shopping  Avoid buying canned, premade, or processed foods. These foods tend to be high in fat, sodium, and added sugar.  Shop around the outside edge of the grocery store. This includes fresh fruits and vegetables, bulk grains, fresh meats, and fresh dairy. Cooking  Use low-heat cooking methods, such as baking, instead of high-heat cooking methods like deep frying.  Cook using healthy oils, such as olive, canola, or sunflower oil.  Avoid cooking with butter, cream, or high-fat meats. Meal planning  Eat meals and snacks regularly, preferably at the same times every day. Avoid going long periods of time without eating.  Eat foods high in fiber, such as fresh fruits, vegetables, beans, and whole grains. Talk to your dietitian about how many servings of carbs you can eat at each meal.  Eat 4-6 ounces (oz) of lean protein each day, such as lean meat, chicken, fish, eggs, or tofu. One oz of lean protein is equal to: ? 1 oz of meat, chicken, or fish. ? 1 egg. ?  cup of tofu.  Eat some foods each day that contain  healthy fats, such as avocado, nuts, seeds, and fish. Lifestyle  Check your blood glucose regularly.  Exercise regularly as told by your health care provider. This may include: ? 150 minutes of moderate-intensity or vigorous-intensity exercise each week. This could be brisk walking, biking, or water aerobics. ? Stretching and doing strength exercises, such as yoga or weightlifting, at least 2 times a week.  Take medicines as told by your health care provider.  Do not use any products that contain nicotine or tobacco, such as cigarettes and e-cigarettes. If you need help quitting, ask your health care provider.  Work with a counselor or diabetes educator to identify strategies to manage stress and any emotional and social challenges. Questions to ask a health care provider  Do I need to meet with a diabetes educator?  Do I need to meet with a dietitian?  What number can I call if I have questions?  When are the best times to   times to check my blood glucose? Where to find more information:  American Diabetes Association: diabetes.org  Academy of Nutrition and Dietetics: www.eatright.org  National Institute of Diabetes and Digestive and Kidney Diseases (NIH): www.niddk.nih.gov Summary  A healthy meal plan will help you control your blood glucose and maintain a healthy lifestyle.  Working with a diet and nutrition specialist (dietitian) can help you make a meal plan that is best for you.  Keep in mind that carbohydrates (carbs) and alcohol have immediate effects on your blood glucose levels. It is important to count carbs and to use alcohol carefully. This information is not intended to replace advice given to you by your health care provider. Make sure you discuss any questions you have with your health care provider. Document Revised: 08/06/2017 Document Reviewed: 09/28/2016 Elsevier Patient Education  2020 Elsevier Inc.  

## 2020-04-16 NOTE — Addendum Note (Signed)
Addended by: Darlina Rumpf A on: 04/16/2020 10:19 AM   Modules accepted: Orders

## 2020-04-16 NOTE — Telephone Encounter (Signed)
Faxed prescription for blood glucose meter kit and supplies kit  to [facility]. The patient's insurance will not pay for Contour blood glucose meter, only the Acc-Chek or One Touch.

## 2020-04-16 NOTE — Progress Notes (Addendum)
Kerri Sims 49 y.o.   Chief Complaint  Patient presents with  . Medication Refill    HISTORY OF PRESENT ILLNESS: This is a 49 y.o. female with history of diabetes and dyslipidemia.  Here for follow-up today. #1 diabetes: On Metformin 1000 mg twice a day Lab Results  Component Value Date   HGBA1C 6.2 (H) 05/03/2019  #2 dyslipidemia: On rosuvastatin 10 mg daily but ran out of medication 2 months ago. Lab Results  Component Value Date   CHOL 130 05/03/2019   HDL 62 05/03/2019   LDLCALC 56 05/03/2019   TRIG 58 05/03/2019   CHOLHDL 2.1 05/03/2019   BP Readings from Last 3 Encounters:  05/03/19 112/73  11/02/18 99/65  07/27/18 106/72  Patient is a registered nurse working night shifts. Has no complaints or medical concerns today.  HPI   Prior to Admission medications   Medication Sig Start Date End Date Taking? Authorizing Provider  Blood Glucose Monitoring Suppl (CONTOUR NEXT MONITOR) w/Device KIT USE TO CHECK GLUCOSE AS DIRECTED 08/24/19   Horald Pollen, MD  CONTOUR NEXT TEST test strip USE  STRIP TO CHECK GLUCOSE 4 TIMES DAILY 08/24/19   Horald Pollen, MD  Iron-FA-B Cmp-C-Biot-Probiotic (FUSION PLUS) CAPS Take 1 capsule every morning by mouth. Patient not taking: Reported on 11/02/2018 07/19/17   Horald Pollen, MD  metFORMIN (GLUCOPHAGE) 1000 MG tablet TAKE 1 TABLET BY MOUTH TWICE DAILY WITH A MEAL 10/12/19   Horald Pollen, MD  rosuvastatin (CRESTOR) 10 MG tablet Take 1 tablet by mouth once daily 09/05/19   Horald Pollen, MD    Allergies  Allergen Reactions  . Percocet [Oxycodone-Acetaminophen] Itching    Patient Active Problem List   Diagnosis Date Noted  . Dyslipidemia 07/27/2018  . Type 2 diabetes mellitus without complication, without long-term current use of insulin ([place]) 12/10/2017    Past Medical History:  Diagnosis Date  . Medical history non-contributory   . PONV (postoperative nausea and vomiting)      Past Surgical History:  Procedure Laterality Date  . ABDOMINAL HYSTERECTOMY N/A 03/31/2013   Procedure: HYSTERECTOMY ABDOMINAL;  Surgeon: Lahoma Crocker, MD;  Location: [facility];  Service: Gynecology;  Laterality: N/A;  fibroids  . BILATERAL SALPINGECTOMY Bilateral 03/31/2013   Procedure: BILATERAL SALPINGECTOMY;  Surgeon: Lahoma Crocker, MD;  Location: [facility];  Service: Gynecology;  Laterality: Bilateral;  . CESAREAN SECTION      Social History   Socioeconomic History  . Marital status: Married    Spouse name: Not on file  . Number of children: Not on file  . Years of education: Not on file  . Highest education level: Not on file  Occupational History  . Not on file  Tobacco Use  . Smoking status: Never Smoker  . Smokeless tobacco: Never Used  Substance and Sexual Activity  . Alcohol use: No  . Drug use: No  . Sexual activity: Not on file  Other Topics Concern  . Not on file  Social History Narrative  . Not on file   Social Determinants of Health   Financial Resource Strain:   . Difficulty of Paying Living Expenses:   Food Insecurity:   . Worried About Charity fundraiser in the Last Year:   . Arboriculturist in the Last Year:   Transportation Needs:   . Film/video editor (Medical):   Marland Kitchen Lack of Transportation (Non-Medical):   Physical Activity:   . Days of Exercise per Week:   .  Minutes of Exercise per Session:   Stress:   . Feeling of Stress :   Social Connections:   . Frequency of Communication with Friends and Family:   . Frequency of Social Gatherings with Friends and Family:   . Attends Religious Services:   . Active Member of Clubs or Organizations:   . Attends Archivist Meetings:   Marland Kitchen Marital Status:   Intimate Partner Violence:   . Fear of Current or Ex-Partner:   . Emotionally Abused:   Marland Kitchen Physically Abused:   . Sexually Abused:     Family History  Problem Relation Age of Onset  . Hyperlipidemia Mother   . Hypertension  Mother   . Stroke Father      Review of Systems  Constitutional: Negative.  Negative for chills, fever and weight loss.  HENT: Negative.  Negative for congestion and sore throat.   Respiratory: Negative.  Negative for cough and shortness of breath.   Cardiovascular: Negative.  Negative for chest pain and palpitations.  Gastrointestinal: Negative.  Negative for abdominal pain, diarrhea, nausea and vomiting.  Genitourinary: Negative.  Negative for dysuria and hematuria.  Musculoskeletal: Negative.  Negative for back pain, myalgias and neck pain.  Skin: Negative.  Negative for rash.  Neurological: Negative for dizziness and headaches.  All other systems reviewed and are negative.   Today's Vitals   04/16/20 0959  BP: 117/72  Pulse: 75  Temp: 97.9 F (36.6 C)  TempSrc: Temporal  SpO2: 98%  Weight: 193 lb 6.4 oz (87.7 kg)  Height: _0  (1.575 m)   Body mass index is 35.37 kg/m.  Physical Exam Vitals reviewed.  Constitutional:      Appearance: Normal appearance.  HENT:     Head: Normocephalic.  Eyes:     Extraocular Movements: Extraocular movements intact.     Conjunctiva/sclera: Conjunctivae normal.     Pupils: Pupils are equal, round, and reactive to light.  Cardiovascular:     Rate and Rhythm: Normal rate and regular rhythm.     Pulses: Normal pulses.     Heart sounds: Normal heart sounds.  Pulmonary:     Effort: Pulmonary effort is normal.     Breath sounds: Normal breath sounds.  Abdominal:     General: There is no distension.     Palpations: Abdomen is soft.     Tenderness: There is no abdominal tenderness.  Musculoskeletal:        General: Normal range of motion.     Cervical back: Normal range of motion and neck supple.  Skin:    General: Skin is warm and dry.     Capillary Refill: Capillary refill takes less than 2 seconds.  Neurological:     General: No focal deficit present.     Mental Status: She is alert and oriented to person, place, and time.   Psychiatric:        Mood and Affect: Mood normal.        Behavior: Behavior normal.    Results for orders placed or performed in visit on 04/16/20 (from the past 24 hour(s))  POCT glycosylated hemoglobin (Hb A1C)     Status: Abnormal   Collection Time: 04/16/20 10:28 AM  Result Value Ref Range   Hemoglobin A1C 7.8 (A) 4.0 - 5.6 %   HbA1c POC (<> result, manual entry)     HbA1c, POC (prediabetic range)     HbA1c, POC (controlled diabetic range)       ASSESSMENT & PLAN:  Dyslipidemia associated with type 2 diabetes mellitus ([place]) Uncontrolled diabetes with hemoglobin A1c of 7.8.  Patient has been noncompliant with Metformin and diet.  Continue Metformin as prescribed and rosuvastatin.  Diet and nutrition discussed.  Follow-up in 3 months.   Kerri Sims was seen today for medication refill.  Diagnoses and all orders for this visit:  Dyslipidemia associated with type 2 diabetes mellitus ([facility]) -     Comprehensive metabolic panel -     Hemoglobin A1c -     metFORMIN (GLUCOPHAGE) 1000 MG tablet; TAKE 1 TABLET BY MOUTH TWICE DAILY WITH A MEAL -     rosuvastatin (CRESTOR) 10 MG tablet; Take 1 tablet (10 mg total) by mouth daily. -     glucose blood (CONTOUR NEXT TEST) test strip; USE  STRIP TO CHECK GLUCOSE 4 TIMES DAILY -     Blood Glucose Monitoring Suppl (CONTOUR NEXT MONITOR) w/Device KIT; USE TO CHECK GLUCOSE AS DIRECTED -     Microalbumin, urine  Body mass index (BMI) of 35.0-35.9 in adult    Patient Instructions       If you have lab work done today you will be contacted with your lab results within the next 2 weeks.  If you have not heard from [place] then please contact us. The fastest way to get your results is to register for My Chart.   IF you received an x-ray today, you will receive an invoice from [facility]. Please contact [facility] at [phone] with questions or concerns regarding your invoice.   IF you received labwork today, you will receive  an invoice from [facility]. Please contact [facility] at [phone] with questions or concerns regarding your invoice.   Our billing staff will not be able to assist you with questions regarding bills from these companies.  You will be contacted with the lab results as soon as they are available. The fastest way to get your results is to activate your My Chart account. Instructions are located on the last page of this paperwork. If you have not heard from [place] regarding the results in 2 weeks, please contact this office.     Diabetes Mellitus and Nutrition, Adult When you have diabetes (diabetes mellitus), it is very important to have healthy eating habits because your blood sugar (glucose) levels are greatly affected by what you eat and drink. Eating healthy foods in the appropriate amounts, at about the same times every day, can help you:  Control your blood glucose.  Lower your risk of heart disease.  Improve your blood pressure.  Reach or maintain a healthy weight. Every person with diabetes is different, and each person has different needs for a meal plan. Your health care provider may recommend that you work with a diet and nutrition specialist (dietitian) to make a meal plan that is best for you. Your meal plan may vary depending on factors such as:  The calories you need.  The medicines you take.  Your weight.  Your blood glucose, blood pressure, and cholesterol levels.  Your activity level.  Other health conditions you have, such as heart or kidney disease. How do carbohydrates affect me? Carbohydrates, also called carbs, affect your blood glucose level more than any other type of food. Eating carbs naturally raises the amount of glucose in your blood. Carb counting is a method for keeping track of how many carbs you eat. Counting carbs is important to keep your blood glucose at a healthy level, especially if you use insulin or  take certain oral diabetes medicines. It is  important to know how many carbs you can safely have in each meal. This is different for every person. Your dietitian can help you calculate how many carbs you should have at each meal and for each snack. Foods that contain carbs include:  Bread, cereal, rice, pasta, and crackers.  Potatoes and corn.  Peas, beans, and lentils.  Milk and yogurt.  Fruit and juice.  Desserts, such as cakes, cookies, ice cream, and candy. How does alcohol affect me? Alcohol can cause a sudden decrease in blood glucose (hypoglycemia), especially if you use insulin or take certain oral diabetes medicines. Hypoglycemia can be a life-threatening condition. Symptoms of hypoglycemia (sleepiness, dizziness, and confusion) are similar to symptoms of having too much alcohol. If your health care provider says that alcohol is safe for you, follow these guidelines:  Limit alcohol intake to no more than 1 drink per day for nonpregnant women and 2 drinks per day for men. One drink equals 12 oz of beer, 5 oz of wine, or 1 oz of hard liquor.  Do not drink on an empty stomach.  Keep yourself hydrated with water, diet soda, or unsweetened iced tea.  Keep in mind that regular soda, juice, and other mixers may contain a lot of sugar and must be counted as carbs. What are tips for following this plan?  Reading food labels  Start by checking the serving size on the "Nutrition Facts" label of packaged foods and drinks. The amount of calories, carbs, fats, and other nutrients listed on the label is based on one serving of the item. Many items contain more than one serving per package.  Check the total grams (g) of carbs in one serving. You can calculate the number of servings of carbs in one serving by dividing the total carbs by 15. For example, if a food has 30 g of total carbs, it would be equal to 2 servings of carbs.  Check the number of grams (g) of saturated and trans fats in one serving. Choose foods that have low or  no amount of these fats.  Check the number of milligrams (mg) of salt (sodium) in one serving. Most people should limit total sodium intake to less than 2,300 mg per day.  Always check the nutrition information of foods labeled as "low-fat" or "nonfat". These foods may be higher in added sugar or refined carbs and should be avoided.  Talk to your dietitian to identify your daily goals for nutrients listed on the label. Shopping  Avoid buying canned, premade, or processed foods. These foods tend to be high in fat, sodium, and added sugar.  Shop around the outside edge of the grocery store. This includes fresh fruits and vegetables, bulk grains, fresh meats, and fresh dairy. Cooking  Use low-heat cooking methods, such as baking, instead of high-heat cooking methods like deep frying.  Cook using healthy oils, such as olive, canola, or sunflower oil.  Avoid cooking with butter, cream, or high-fat meats. Meal planning  Eat meals and snacks regularly, preferably at the same times every day. Avoid going long periods of time without eating.  Eat foods high in fiber, such as fresh fruits, vegetables, beans, and whole grains. Talk to your dietitian about how many servings of carbs you can eat at each meal.  Eat 4-6 ounces (oz) of lean protein each day, such as lean meat, chicken, fish, eggs, or tofu. One oz of lean protein is equal to: ?  1 oz of meat, chicken, or fish. ? 1 egg. ?  cup of tofu.  Eat some foods each day that contain healthy fats, such as avocado, nuts, seeds, and fish. Lifestyle  Check your blood glucose regularly.  Exercise regularly as told by your health care provider. This may include: ? 150 minutes of moderate-intensity or vigorous-intensity exercise each week. This could be brisk walking, biking, or water aerobics. ? Stretching and doing strength exercises, such as yoga or weightlifting, at least 2 times a week.  Take medicines as told by your health care  provider.  Do not use any products that contain nicotine or tobacco, such as cigarettes and e-cigarettes. If you need help quitting, ask your health care provider.  Work with a Social worker or diabetes educator to identify strategies to manage stress and any emotional and social challenges. Questions to ask a health care provider  Do I need to meet with a diabetes educator?  Do I need to meet with a dietitian?  What number can I call if I have questions?  When are the best times to check my blood glucose? Where to find more information:  American Diabetes Association: diabetes.org  Academy of Nutrition and Dietetics: www.eatright.[employer] of Diabetes and Digestive and Kidney Diseases (NIH): [employer] Summary  A healthy meal plan will help you control your blood glucose and maintain a healthy lifestyle.  Working with a diet and nutrition specialist (dietitian) can help you make a meal plan that is best for you.  Keep in mind that carbohydrates (carbs) and alcohol have immediate effects on your blood glucose levels. It is important to count carbs and to use alcohol carefully. This information is not intended to replace advice given to you by your health care provider. Make sure you discuss any questions you have with your health care provider. Document Revised: 08/06/2017 Document Reviewed: 09/28/2016 Elsevier Patient Education  2020 Elsevier Inc.      Agustina Caroli, MD [facility]

## 2020-04-16 NOTE — Addendum Note (Signed)
Addended by: Alfredia Ferguson A on: 04/16/2020 12:14 PM   Modules accepted: Orders

## 2020-04-17 LAB — MICROALBUMIN, URINE: Microalbumin, Urine: <3 ug/mL

## 2020-06-04 ENCOUNTER — Ambulatory Visit (INDEPENDENT_AMBULATORY_CARE_PROVIDER_SITE_OTHER): Payer: Commercial Managed Care - PPO | Admitting: Emergency Medicine

## 2020-06-04 ENCOUNTER — Other Ambulatory Visit: Payer: Self-pay

## 2020-06-04 DIAGNOSIS — Z23 Encounter for immunization: Secondary | ICD-10-CM | POA: Diagnosis not present

## 2020-06-04 NOTE — Patient Instructions (Signed)
° ° ° °  If you have lab work done today you will be contacted with your lab results within the next 2 weeks.  If you have not heard from us then please contact us. The fastest way to get your results is to register for My Chart. ° ° °IF you received an x-ray today, you will receive an invoice from [facility]. Please contact  Radiology at [phone] with questions or concerns regarding your invoice.  ° °IF you received labwork today, you will receive an invoice from [facility]. Please contact [facility] at [phone] with questions or concerns regarding your invoice.  ° °Our billing staff will not be able to assist you with questions regarding bills from these companies. ° °You will be contacted with the lab results as soon as they are available. The fastest way to get your results is to activate your My Chart account. Instructions are located on the last page of this paperwork. If you have not heard from us regarding the results in 2 weeks, please contact this office. °  ° ° ° °

## 2020-06-27 ENCOUNTER — Telehealth: Payer: Self-pay | Admitting: Emergency Medicine

## 2020-06-27 NOTE — Telephone Encounter (Signed)
Completed and handed back to patient

## 2020-06-27 NOTE — Telephone Encounter (Signed)
Pt dropped off paper work for flu vaccine to be completed for her work   Put in provider.cna box to be completed

## 2020-07-24 ENCOUNTER — Other Ambulatory Visit: Payer: Self-pay

## 2020-07-24 ENCOUNTER — Encounter: Payer: Self-pay | Admitting: Emergency Medicine

## 2020-07-24 ENCOUNTER — Ambulatory Visit (INDEPENDENT_AMBULATORY_CARE_PROVIDER_SITE_OTHER): Payer: Commercial Managed Care - PPO | Admitting: Emergency Medicine

## 2020-07-24 VITALS — BP 123/82 | HR 76 | Temp 97.8°F | Ht 62.0 in | Wt 193.2 lb

## 2020-07-24 DIAGNOSIS — E785 Hyperlipidemia, unspecified: Secondary | ICD-10-CM

## 2020-07-24 DIAGNOSIS — E1165 Type 2 diabetes mellitus with hyperglycemia: Secondary | ICD-10-CM | POA: Diagnosis not present

## 2020-07-24 DIAGNOSIS — Z6835 Body mass index (BMI) 35.0-35.9, adult: Secondary | ICD-10-CM

## 2020-07-24 DIAGNOSIS — E1169 Type 2 diabetes mellitus with other specified complication: Secondary | ICD-10-CM

## 2020-07-24 LAB — POCT GLYCOSYLATED HEMOGLOBIN (HGB A1C): Hemoglobin A1C: 7.1 % — AB (ref 4.0–5.6)

## 2020-07-24 LAB — GLUCOSE, POCT (MANUAL RESULT ENTRY): POC Glucose: 122 mg/dl — AB (ref 70–99)

## 2020-07-24 MED ORDER — TRULICITY 0.75 MG/0.5ML ~~LOC~~ SOAJ: 0.7500 mg | SUBCUTANEOUS | 5 refills | Status: DC

## 2020-07-24 NOTE — Addendum Note (Signed)
Addended by: Davina Poke on: 07/24/2020 09:33 AM   Modules accepted: Miquel Dunn

## 2020-07-24 NOTE — Patient Instructions (Addendum)
If you have lab work done today you will be contacted with your lab results within the next 2 weeks.  If you have not heard from [place] then please contact us. The fastest way to get your results is to register for My Chart.   IF you received an x-ray today, you will receive an invoice from [facility]. Please contact [facility] at [phone] with questions or concerns regarding your invoice.   IF you received labwork today, you will receive an invoice from [facility]. Please contact [facility] at [phone] with questions or concerns regarding your invoice.   Our billing staff will not be able to assist you with questions regarding bills from these companies.  You will be contacted with the lab results as soon as they are available. The fastest way to get your results is to activate your My Chart account. Instructions are located on the last page of this paperwork. If you have not heard from [place] regarding the results in 2 weeks, please contact this office.      Diabetes Mellitus and Nutrition, Adult When you have diabetes (diabetes mellitus), it is very important to have healthy eating habits because your blood sugar (glucose) levels are greatly affected by what you eat and drink. Eating healthy foods in the appropriate amounts, at about the same times every day, can help you:  Control your blood glucose.  Lower your risk of heart disease.  Improve your blood pressure.  Reach or maintain a healthy weight. Every person with diabetes is different, and each person has different needs for a meal plan. Your health care provider may recommend that you work with a diet and nutrition specialist (dietitian) to make a meal plan that is best for you. Your meal plan may vary depending on factors such as:  The calories you need.  The medicines you take.  Your weight.  Your blood glucose, blood pressure, and cholesterol levels.  Your activity level.  Other health  conditions you have, such as heart or kidney disease. How do carbohydrates affect me? Carbohydrates, also called carbs, affect your blood glucose level more than any other type of food. Eating carbs naturally raises the amount of glucose in your blood. Carb counting is a method for keeping track of how many carbs you eat. Counting carbs is important to keep your blood glucose at a healthy level, especially if you use insulin or take certain oral diabetes medicines. It is important to know how many carbs you can safely have in each meal. This is different for every person. Your dietitian can help you calculate how many carbs you should have at each meal and for each snack. Foods that contain carbs include:  Bread, cereal, rice, pasta, and crackers.  Potatoes and corn.  Peas, beans, and lentils.  Milk and yogurt.  Fruit and juice.  Desserts, such as cakes, cookies, ice cream, and candy. How does alcohol affect me? Alcohol can cause a sudden decrease in blood glucose (hypoglycemia), especially if you use insulin or take certain oral diabetes medicines. Hypoglycemia can be a life-threatening condition. Symptoms of hypoglycemia (sleepiness, dizziness, and confusion) are similar to symptoms of having too much alcohol. If your health care provider says that alcohol is safe for you, follow these guidelines:  Limit alcohol intake to no more than 1 drink per day for nonpregnant women and 2 drinks per day for men. One drink equals 12 oz of beer, 5 oz of wine, or 1 oz of hard  liquor.  Do not drink on an empty stomach.  Keep yourself hydrated with water, diet soda, or unsweetened iced tea.  Keep in mind that regular soda, juice, and other mixers may contain a lot of sugar and must be counted as carbs. What are tips for following this plan?  Reading food labels  Start by checking the serving size on the "Nutrition Facts" label of packaged foods and drinks. The amount of calories, carbs, fats, and  other nutrients listed on the label is based on one serving of the item. Many items contain more than one serving per package.  Check the total grams (g) of carbs in one serving. You can calculate the number of servings of carbs in one serving by dividing the total carbs by 15. For example, if a food has 30 g of total carbs, it would be equal to 2 servings of carbs.  Check the number of grams (g) of saturated and trans fats in one serving. Choose foods that have low or no amount of these fats.  Check the number of milligrams (mg) of salt (sodium) in one serving. Most people should limit total sodium intake to less than 2,300 mg per day.  Always check the nutrition information of foods labeled as "low-fat" or "nonfat". These foods may be higher in added sugar or refined carbs and should be avoided.  Talk to your dietitian to identify your daily goals for nutrients listed on the label. Shopping  Avoid buying canned, premade, or processed foods. These foods tend to be high in fat, sodium, and added sugar.  Shop around the outside edge of the grocery store. This includes fresh fruits and vegetables, bulk grains, fresh meats, and fresh dairy. Cooking  Use low-heat cooking methods, such as baking, instead of high-heat cooking methods like deep frying.  Cook using healthy oils, such as olive, canola, or sunflower oil.  Avoid cooking with butter, cream, or high-fat meats. Meal planning  Eat meals and snacks regularly, preferably at the same times every day. Avoid going long periods of time without eating.  Eat foods high in fiber, such as fresh fruits, vegetables, beans, and whole grains. Talk to your dietitian about how many servings of carbs you can eat at each meal.  Eat 4-6 ounces (oz) of lean protein each day, such as lean meat, chicken, fish, eggs, or tofu. One oz of lean protein is equal to: ? 1 oz of meat, chicken, or fish. ? 1 egg. ?  cup of tofu.  Eat some foods each day that  contain healthy fats, such as avocado, nuts, seeds, and fish. Lifestyle  Check your blood glucose regularly.  Exercise regularly as told by your health care provider. This may include: ? 150 minutes of moderate-intensity or vigorous-intensity exercise each week. This could be brisk walking, biking, or water aerobics. ? Stretching and doing strength exercises, such as yoga or weightlifting, at least 2 times a week.  Take medicines as told by your health care provider.  Do not use any products that contain nicotine or tobacco, such as cigarettes and e-cigarettes. If you need help quitting, ask your health care provider.  Work with a Social worker or diabetes educator to identify strategies to manage stress and any emotional and social challenges. Questions to ask a health care provider  Do I need to meet with a diabetes educator?  Do I need to meet with a dietitian?  What number can I call if I have questions?  When are the best  times to check my blood glucose? Where to find more information:  American Diabetes Association: diabetes.org  Academy of Nutrition and Dietetics: www.eatright.[employer] of Diabetes and Digestive and Kidney Diseases (NIH): [employer] Summary  A healthy meal plan will help you control your blood glucose and maintain a healthy lifestyle.  Working with a diet and nutrition specialist (dietitian) can help you make a meal plan that is best for you.  Keep in mind that carbohydrates (carbs) and alcohol have immediate effects on your blood glucose levels. It is important to count carbs and to use alcohol carefully. This information is not intended to replace advice given to you by your health care provider. Make sure you discuss any questions you have with your health care provider. Document Revised: 08/06/2017 Document Reviewed: 09/28/2016 Elsevier Patient Education  2020 [employer].

## 2020-07-24 NOTE — Progress Notes (Addendum)
Kerri Sims 49 y.o.   Chief Complaint  Patient presents with  . Diabetes    f/u   . Hyperlipidemia    HISTORY OF PRESENT ILLNESS: This is a 49 y.o. female with history of diabetes on Metformin twice a day here for follow-up. Also has a history of dyslipidemia on rosuvastatin 10 mg daily. RN working night shifts. Has no complaints or medical concerns.  HPI   Prior to Admission medications   Medication Sig Start Date End Date Taking? Authorizing Provider  Accu-Chek Softclix Lancets lancets 4 (four) times daily. 04/17/20  Yes [provider]  blood glucose meter kit and supplies KIT Dispense based on patient and insurance preference. Use up to four times daily as directed. (FOR ICD-9 250.00, 250.01). 04/16/20  Yes Viraj Liby, Ines Bloomer, MD  Blood Glucose Monitoring Suppl (CONTOUR NEXT MONITOR) w/Device KIT USE TO CHECK GLUCOSE AS DIRECTED 04/16/20  Yes Rainier Feuerborn, Ines Bloomer, MD  glucose blood (CONTOUR NEXT TEST) test strip USE  STRIP TO CHECK GLUCOSE 4 TIMES DAILY 04/16/20  Yes Horald Pollen, MD  metFORMIN (GLUCOPHAGE) 1000 MG tablet TAKE 1 TABLET BY MOUTH TWICE DAILY WITH A MEAL 04/16/20  Yes Sulma Ruffino, Ines Bloomer, MD  rosuvastatin (CRESTOR) 10 MG tablet Take 1 tablet (10 mg total) by mouth daily. 04/16/20 07/24/20 Yes Homa HillsInes Bloomer, MD    Allergies  Allergen Reactions  . Percocet [Oxycodone-Acetaminophen] Itching    Patient Active Problem List   Diagnosis Date Noted  . Dyslipidemia 07/27/2018  . Dyslipidemia associated with type 2 diabetes mellitus ([place]) 12/10/2017    Past Medical History:  Diagnosis Date  . Medical history non-contributory   . PONV (postoperative nausea and vomiting)     Past Surgical History:  Procedure Laterality Date  . ABDOMINAL HYSTERECTOMY N/A 03/31/2013   Procedure: HYSTERECTOMY ABDOMINAL;  Surgeon: Lahoma Crocker, MD;  Location: [facility];  Service: Gynecology;  Laterality: N/A;  fibroids  . BILATERAL  SALPINGECTOMY Bilateral 03/31/2013   Procedure: BILATERAL SALPINGECTOMY;  Surgeon: Lahoma Crocker, MD;  Location: [facility];  Service: Gynecology;  Laterality: Bilateral;  . CESAREAN SECTION      Social History   Socioeconomic History  . Marital status: Married    Spouse name: Not on file  . Number of children: Not on file  . Years of education: Not on file  . Highest education level: Not on file  Occupational History  . Not on file  Tobacco Use  . Smoking status: Never Smoker  . Smokeless tobacco: Never Used  Substance and Sexual Activity  . Alcohol use: No  . Drug use: No  . Sexual activity: Not on file  Other Topics Concern  . Not on file  Social History Narrative  . Not on file   Social Determinants of Health   Financial Resource Strain:   . Difficulty of Paying Living Expenses: Not on file  Food Insecurity:   . Worried About Charity fundraiser in the Last Year: Not on file  . Ran Out of Food in the Last Year: Not on file  Transportation Needs:   . Lack of Transportation (Medical): Not on file  . Lack of Transportation (Non-Medical): Not on file  Physical Activity:   . Days of Exercise per Week: Not on file  . Minutes of Exercise per Session: Not on file  Stress:   . Feeling of Stress : Not on file  Social Connections:   . Frequency of Communication with Friends and Family: Not on file  .  Frequency of Social Gatherings with Friends and Family: Not on file  . Attends Religious Services: Not on file  . Active Member of Clubs or Organizations: Not on file  . Attends Archivist Meetings: Not on file  . Marital Status: Not on file  Intimate Partner Violence:   . Fear of Current or Ex-Partner: Not on file  . Emotionally Abused: Not on file  . Physically Abused: Not on file  . Sexually Abused: Not on file    Family History  Problem Relation Age of Onset  . Hyperlipidemia Mother   . Hypertension Mother   . Stroke Father      Review of Systems    Constitutional: Negative.  Negative for chills and fever.  HENT: Negative.   Respiratory: Negative.  Negative for cough and shortness of breath.   Cardiovascular: Negative.  Negative for chest pain and palpitations.  Gastrointestinal: Negative.  Negative for abdominal pain, blood in stool, diarrhea, melena, nausea and vomiting.  Genitourinary: Negative.  Negative for dysuria and hematuria.  Musculoskeletal: Negative.   Skin: Negative.  Negative for rash.  Neurological: Negative.  Negative for dizziness and headaches.  All other systems reviewed and are negative.  Today's Vitals   07/24/20 0829  BP: 123/82  Pulse: 76  Temp: 97.8 F (36.6 C)  TempSrc: Temporal  SpO2: 99%  Weight: 193 lb 3.2 oz (87.6 kg)  Height: _0  (1.575 m)   Body mass index is 35.34 kg/m.   Physical Exam Vitals reviewed.  Constitutional:      Appearance: Normal appearance.  HENT:     Head: Normocephalic.  Eyes:     Extraocular Movements: Extraocular movements intact.     Conjunctiva/sclera: Conjunctivae normal.     Pupils: Pupils are equal, round, and reactive to light.  Cardiovascular:     Rate and Rhythm: Normal rate and regular rhythm.     Pulses: Normal pulses.     Heart sounds: Normal heart sounds.  Pulmonary:     Effort: Pulmonary effort is normal.     Breath sounds: Normal breath sounds.  Musculoskeletal:        General: Normal range of motion.     Cervical back: Normal range of motion and neck supple.  Skin:    General: Skin is warm and dry.  Neurological:     General: No focal deficit present.     Mental Status: She is alert and oriented to person, place, and time.  Psychiatric:        Mood and Affect: Mood normal.        Behavior: Behavior normal.     Results for orders placed or performed in visit on 07/24/20 (from the past 24 hour(s))  POCT glucose (manual entry)     Status: Abnormal   Collection Time: 07/24/20  8:54 AM  Result Value Ref Range   POC Glucose 122 (A) 70 - 99  mg/dl  POCT glycosylated hemoglobin (Hb A1C)     Status: Abnormal   Collection Time: 07/24/20  8:58 AM  Result Value Ref Range   Hemoglobin A1C 7.1 (A) 4.0 - 5.6 %   HbA1c POC (<> result, manual entry)     HbA1c, POC (prediabetic range)     HbA1c, POC (controlled diabetic range)     A total of 30 minutes was spent with the patient, greater than 50% of which was in counseling/coordination of care regarding diabetes and cardiovascular risks associated with this condition, review of all medications, review of  new medication Trulicity, review of most recent blood work results including today's hemoglobin A1c, review of most recent office visit note, education on nutrition, documentation, prognosis and need for follow-up in 3 months.  ASSESSMENT & PLAN: Dyslipidemia associated with type 2 diabetes mellitus ([place]) Uncontrolled diabetes with hemoglobin A1c at 7.1.  High BMI. Continue Metformin 1000 mg twice a day Start Trulicity 6.07 mg weekly. Diet and nutrition discussed Continue statin therapy with rosuvastatin 10 mg daily. Follow-up in 3 months.  Kerri Sims was seen today for diabetes and hyperlipidemia.  Diagnoses and all orders for this visit:  Dyslipidemia associated with type 2 diabetes mellitus ([place]) -     POCT glucose (manual entry) -     POCT glycosylated hemoglobin (Hb A1C) -     CMP14+EGFR -     Lipid panel  Type 2 diabetes mellitus with hyperglycemia, without long-term current use of insulin ([facility]) -     Dulaglutide (TRULICITY) 3.71 GG/2.6RS SOPN; Inject 0.75 mg into the skin once a week.  Body mass index (BMI) of 35.0-35.9 in adult     Patient Instructions       If you have lab work done today you will be contacted with your lab results within the next 2 weeks.  If you have not heard from [place] then please contact us. The fastest way to get your results is to register for My Chart.   IF you received an x-ray today, you will receive an invoice from [facility].  Please contact [facility] at [phone] with questions or concerns regarding your invoice.   IF you received labwork today, you will receive an invoice from [facility]. Please contact [facility] at [phone] with questions or concerns regarding your invoice.   Our billing staff will not be able to assist you with questions regarding bills from these companies.  You will be contacted with the lab results as soon as they are available. The fastest way to get your results is to activate your My Chart account. Instructions are located on the last page of this paperwork. If you have not heard from [place] regarding the results in 2 weeks, please contact this office.      Diabetes Mellitus and Nutrition, Adult When you have diabetes (diabetes mellitus), it is very important to have healthy eating habits because your blood sugar (glucose) levels are greatly affected by what you eat and drink. Eating healthy foods in the appropriate amounts, at about the same times every day, can help you:  Control your blood glucose.  Lower your risk of heart disease.  Improve your blood pressure.  Reach or maintain a healthy weight. Every person with diabetes is different, and each person has different needs for a meal plan. Your health care provider may recommend that you work with a diet and nutrition specialist (dietitian) to make a meal plan that is best for you. Your meal plan may vary depending on factors such as:  The calories you need.  The medicines you take.  Your weight.  Your blood glucose, blood pressure, and cholesterol levels.  Your activity level.  Other health conditions you have, such as heart or kidney disease. How do carbohydrates affect me? Carbohydrates, also called carbs, affect your blood glucose level more than any other type of food. Eating carbs naturally raises the amount of glucose in your blood. Carb counting is a method for keeping track of how many carbs you eat.  Counting carbs is important to keep your blood glucose at a  healthy level, especially if you use insulin or take certain oral diabetes medicines. It is important to know how many carbs you can safely have in each meal. This is different for every person. Your dietitian can help you calculate how many carbs you should have at each meal and for each snack. Foods that contain carbs include:  Bread, cereal, rice, pasta, and crackers.  Potatoes and corn.  Peas, beans, and lentils.  Milk and yogurt.  Fruit and juice.  Desserts, such as cakes, cookies, ice cream, and candy. How does alcohol affect me? Alcohol can cause a sudden decrease in blood glucose (hypoglycemia), especially if you use insulin or take certain oral diabetes medicines. Hypoglycemia can be a life-threatening condition. Symptoms of hypoglycemia (sleepiness, dizziness, and confusion) are similar to symptoms of having too much alcohol. If your health care provider says that alcohol is safe for you, follow these guidelines:  Limit alcohol intake to no more than 1 drink per day for nonpregnant women and 2 drinks per day for men. One drink equals 12 oz of beer, 5 oz of wine, or 1 oz of hard liquor.  Do not drink on an empty stomach.  Keep yourself hydrated with water, diet soda, or unsweetened iced tea.  Keep in mind that regular soda, juice, and other mixers may contain a lot of sugar and must be counted as carbs. What are tips for following this plan?  Reading food labels  Start by checking the serving size on the "Nutrition Facts" label of packaged foods and drinks. The amount of calories, carbs, fats, and other nutrients listed on the label is based on one serving of the item. Many items contain more than one serving per package.  Check the total grams (g) of carbs in one serving. You can calculate the number of servings of carbs in one serving by dividing the total carbs by 15. For example, if a food has 30 g of total  carbs, it would be equal to 2 servings of carbs.  Check the number of grams (g) of saturated and trans fats in one serving. Choose foods that have low or no amount of these fats.  Check the number of milligrams (mg) of salt (sodium) in one serving. Most people should limit total sodium intake to less than 2,300 mg per day.  Always check the nutrition information of foods labeled as "low-fat" or "nonfat". These foods may be higher in added sugar or refined carbs and should be avoided.  Talk to your dietitian to identify your daily goals for nutrients listed on the label. Shopping  Avoid buying canned, premade, or processed foods. These foods tend to be high in fat, sodium, and added sugar.  Shop around the outside edge of the grocery store. This includes fresh fruits and vegetables, bulk grains, fresh meats, and fresh dairy. Cooking  Use low-heat cooking methods, such as baking, instead of high-heat cooking methods like deep frying.  Cook using healthy oils, such as olive, canola, or sunflower oil.  Avoid cooking with butter, cream, or high-fat meats. Meal planning  Eat meals and snacks regularly, preferably at the same times every day. Avoid going long periods of time without eating.  Eat foods high in fiber, such as fresh fruits, vegetables, beans, and whole grains. Talk to your dietitian about how many servings of carbs you can eat at each meal.  Eat 4-6 ounces (oz) of lean protein each day, such as lean meat, chicken, fish, eggs, or tofu. One oz  of lean protein is equal to: ? 1 oz of meat, chicken, or fish. ? 1 egg. ?  cup of tofu.  Eat some foods each day that contain healthy fats, such as avocado, nuts, seeds, and fish. Lifestyle  Check your blood glucose regularly.  Exercise regularly as told by your health care provider. This may include: ? 150 minutes of moderate-intensity or vigorous-intensity exercise each week. This could be brisk walking, biking, or water  aerobics. ? Stretching and doing strength exercises, such as yoga or weightlifting, at least 2 times a week.  Take medicines as told by your health care provider.  Do not use any products that contain nicotine or tobacco, such as cigarettes and e-cigarettes. If you need help quitting, ask your health care provider.  Work with a Social worker or diabetes educator to identify strategies to manage stress and any emotional and social challenges. Questions to ask a health care provider  Do I need to meet with a diabetes educator?  Do I need to meet with a dietitian?  What number can I call if I have questions?  When are the best times to check my blood glucose? Where to find more information:  American Diabetes Association: diabetes.org  Academy of Nutrition and Dietetics: www.eatright.[employer] of Diabetes and Digestive and Kidney Diseases (NIH): [employer] Summary  A healthy meal plan will help you control your blood glucose and maintain a healthy lifestyle.  Working with a diet and nutrition specialist (dietitian) can help you make a meal plan that is best for you.  Keep in mind that carbohydrates (carbs) and alcohol have immediate effects on your blood glucose levels. It is important to count carbs and to use alcohol carefully. This information is not intended to replace advice given to you by your health care provider. Make sure you discuss any questions you have with your health care provider. Document Revised: 08/06/2017 Document Reviewed: 09/28/2016 Elsevier Patient Education  2020 [employer] Inc.      Agustina Caroli, MD [facility]

## 2020-07-24 NOTE — Assessment & Plan Note (Signed)
Uncontrolled diabetes with hemoglobin A1c at 7.1.  High BMI. Continue Metformin 1000 mg twice a day Start Trulicity 0.17 mg weekly. Diet and nutrition discussed Continue statin therapy with rosuvastatin 10 mg daily. Follow-up in 3 months.

## 2020-07-25 LAB — CMP14+EGFR
ALT: 14 IU/L (ref 0–32)
AST: 14 IU/L (ref 0–40)
Albumin/Globulin Ratio: 1.8 (ref 1.2–2.2)
Albumin: 4.7 g/dL (ref 3.8–4.8)
Alkaline Phosphatase: 59 IU/L (ref 44–121)
BUN/Creatinine Ratio: 17 (ref 9–23)
BUN: 11 mg/dL (ref 6–24)
Bilirubin Total: 0.3 mg/dL (ref 0.0–1.2)
CO2: 23 mmol/L (ref 20–29)
Calcium: 9.8 mg/dL (ref 8.7–10.2)
Chloride: 100 mmol/L (ref 96–106)
Creatinine, Ser: 0.66 mg/dL (ref 0.57–1.00)
GFR calc Af Amer: 119 mL/min/{1.73_m2} (ref >59)
GFR calc non Af Amer: 103 mL/min/{1.73_m2} (ref >59)
Globulin, Total: 2.6 g/dL (ref 1.5–4.5)
Glucose: 115 mg/dL — ABNORMAL HIGH (ref 65–99)
Potassium: 4.1 mmol/L (ref 3.5–5.2)
Sodium: 139 mmol/L (ref 134–144)
Total Protein: 7.3 g/dL (ref 6.0–8.5)

## 2020-07-25 LAB — LIPID PANEL
Chol/HDL Ratio: 2.6 ratio (ref 0.0–4.4)
Cholesterol, Total: 163 mg/dL (ref 100–199)
HDL: 63 mg/dL (ref >39)
LDL Chol Calc (NIH): 89 mg/dL (ref 0–99)
Triglycerides: 54 mg/dL (ref 0–149)
VLDL Cholesterol Cal: 11 mg/dL (ref 5–40)

## 2020-07-30 ENCOUNTER — Telehealth: Payer: Self-pay | Admitting: *Deleted

## 2020-07-30 NOTE — Telephone Encounter (Signed)
Your PA has been resolved, no additional PA is required. For further inquiries please contact the number on the back of the member prescription card.   trulicity

## 2020-08-08 LAB — HM DIABETES EYE EXAM

## 2020-08-13 ENCOUNTER — Encounter: Payer: Self-pay | Admitting: *Deleted

## 2020-08-14 ENCOUNTER — Other Ambulatory Visit: Payer: Self-pay | Admitting: Emergency Medicine

## 2020-08-14 ENCOUNTER — Telehealth: Payer: Self-pay | Admitting: Emergency Medicine

## 2020-08-14 ENCOUNTER — Other Ambulatory Visit: Payer: Self-pay | Admitting: Family Medicine

## 2020-08-14 DIAGNOSIS — E1165 Type 2 diabetes mellitus with hyperglycemia: Secondary | ICD-10-CM

## 2020-08-14 MED ORDER — GLIPIZIDE 5 MG PO TABS: 5.0000 mg | ORAL_TABLET | Freq: Every day | ORAL | 1 refills | Status: DC

## 2020-08-14 NOTE — Telephone Encounter (Signed)
Patient is requesting to speak with clinical regarding  Her new medication Dulaglutide (TRULICITY) 8.03 OZ/2.2QM SOPN [[id number] Patient currently has no medical coverage and it is going to cost her a 1000.00 dollars / do we have samples , coupons anything to help her out with pricinig   Or is there another medication that cost less

## 2020-08-14 NOTE — Telephone Encounter (Signed)
Patient called and left this message , please advise on her new medication Dulaglutide (TRULICITY) 3.47 QQ/5.9DG SOPN [[id number] Patient currently has no medical coverage and it is going to cost her a 1000.00 dollars / do we have samples , coupons anything to help her out with pricinig or is there another medication that cost less.

## 2020-08-14 NOTE — Telephone Encounter (Signed)
Patient called again regarding this request /

## 2020-08-14 NOTE — Telephone Encounter (Signed)
If she does not have insurance then Trulicity is too expensive and not an option.  Continue taking Metformin.  We will add glipizide 5 mg daily until she can get insurance  and able to afford Trulicity.  Thanks.

## 2020-08-15 NOTE — Telephone Encounter (Signed)
Thanks

## 2020-08-15 NOTE — Telephone Encounter (Signed)
Called patient regarding trulicity medication follow up, she stated she went ahead and paid $7,207 for trulicity for 30 days supply . She does not tolerate taking glipizide . [place] .

## 2021-03-30 ENCOUNTER — Encounter (HOSPITAL_COMMUNITY): Payer: Self-pay | Admitting: Emergency Medicine

## 2021-03-30 ENCOUNTER — Emergency Department (HOSPITAL_COMMUNITY)
Admission: EM | Admit: 2021-03-30 | Discharge: 2021-03-30 | Disposition: A | Discharge status: Home / Self Care | Payer: Commercial Managed Care - PPO | Attending: Emergency Medicine | Admitting: Emergency Medicine

## 2021-03-30 ENCOUNTER — Emergency Department (HOSPITAL_COMMUNITY): Payer: Commercial Managed Care - PPO

## 2021-03-30 DIAGNOSIS — M7918 Myalgia, other site: Secondary | ICD-10-CM

## 2021-03-30 DIAGNOSIS — Y92410 Unspecified street and highway as the place of occurrence of the external cause: Secondary | ICD-10-CM | POA: Insufficient documentation

## 2021-03-30 DIAGNOSIS — R519 Headache, unspecified: Secondary | ICD-10-CM | POA: Diagnosis not present

## 2021-03-30 DIAGNOSIS — Z7984 Long term (current) use of oral hypoglycemic drugs: Secondary | ICD-10-CM | POA: Diagnosis not present

## 2021-03-30 DIAGNOSIS — M545 Low back pain, unspecified: Secondary | ICD-10-CM | POA: Insufficient documentation

## 2021-03-30 DIAGNOSIS — E119 Type 2 diabetes mellitus without complications: Secondary | ICD-10-CM | POA: Diagnosis not present

## 2021-03-30 MED ORDER — MELOXICAM 7.5 MG PO TABS: 7.5000 mg | ORAL_TABLET | Freq: Every day | ORAL | 0 refills | Status: AC

## 2021-03-30 MED ORDER — METHOCARBAMOL 500 MG PO TABS: 500.0000 mg | ORAL_TABLET | Freq: Two times a day (BID) | ORAL | 0 refills | Status: DC
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
## 2021-03-30 NOTE — ED Notes (Signed)
Pt discharged by PA at triage.
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
## 2021-05-14 ENCOUNTER — Telehealth: Payer: Self-pay | Admitting: Emergency Medicine

## 2021-05-14 NOTE — Telephone Encounter (Signed)
1.Medication Requested: glucose blood (CONTOUR NEXT TEST) test strip metFORMIN (GLUCOPHAGE) 1000 MG tablet   2. Pharmacy (Name, [facility], [facility]): [place], [place]  Phone:  [phone] Fax:  [phone]   3. On Med List: yes  4. Last Visit with PCP: 11.17.21  5. Next visit date with PCP: 09.12.22   Agent: Please be advised that RX refills may take up to 3 business days. We ask that you follow-up with your pharmacy.

## 2021-05-16 ENCOUNTER — Telehealth: Payer: Self-pay

## 2021-05-16 NOTE — Telephone Encounter (Signed)
Pt. Called to check on status of refill Please advise.

## 2021-05-19 ENCOUNTER — Ambulatory Visit (INDEPENDENT_AMBULATORY_CARE_PROVIDER_SITE_OTHER): Payer: Commercial Managed Care - PPO | Admitting: Emergency Medicine

## 2021-05-19 ENCOUNTER — Encounter: Payer: Self-pay | Admitting: Emergency Medicine

## 2021-05-19 ENCOUNTER — Other Ambulatory Visit: Payer: Self-pay

## 2021-05-19 VITALS — BP 116/70 | HR 74 | Temp 98.6°F | Ht 62.0 in | Wt 195.0 lb

## 2021-05-19 DIAGNOSIS — E1169 Type 2 diabetes mellitus with other specified complication: Secondary | ICD-10-CM

## 2021-05-19 DIAGNOSIS — Z23 Encounter for immunization: Secondary | ICD-10-CM | POA: Diagnosis not present

## 2021-05-19 DIAGNOSIS — E785 Hyperlipidemia, unspecified: Secondary | ICD-10-CM

## 2021-05-19 LAB — POCT GLYCOSYLATED HEMOGLOBIN (HGB A1C): Hemoglobin A1C: 7.3 % — AB (ref 4.0–5.6)

## 2021-05-19 MED ORDER — METFORMIN HCL 1000 MG PO TABS: ORAL_TABLET | ORAL | 3 refills | Status: DC

## 2021-05-19 MED ORDER — CONTOUR NEXT TEST VI STRP: ORAL_STRIP | 0 refills | Status: DC

## 2021-05-19 MED ORDER — ROSUVASTATIN CALCIUM 10 MG PO TABS: 10.0000 mg | ORAL_TABLET | Freq: Every day | ORAL | 3 refills | Status: DC

## 2021-05-19 MED ORDER — TRULICITY 0.75 MG/0.5ML ~~LOC~~ SOAJ: 0.7500 mg | SUBCUTANEOUS | 33 refills | Status: DC

## 2021-05-19 MED ORDER — ACCU-CHEK SOFTCLIX LANCETS MISC: 1.0000 | Freq: Four times a day (QID) | 3 refills | Status: AC

## 2021-05-19 NOTE — Assessment & Plan Note (Signed)
Uncontrolled diabetes with hemoglobin A1c of 7.3. Continue metformin 1000 mg twice a day. Restart weekly Trulicity [id number] mg Diet and nutrition discussed. Continue rosuvastatin 10 mg daily. Follow-up in 3 months.

## 2021-05-19 NOTE — Progress Notes (Signed)
Kerri Sims 50 y.o.   Chief Complaint  Patient presents with   Diabetes    Follow up diabetes. Medication refill for metformin, blood glu lanscets, and crestor    HISTORY OF PRESENT ILLNESS: This is a 50 y.o. female with history of diabetes and dyslipidemia here for follow-up #1 diabetes: On metformin 1000 mg twice a day.  Stopped taking Trulicity thinking she could control diabetes with diet and nutrition along. #2 dyslipidemia: On rosuvastatin 10 mg daily. Registered nurse, works night shifts, 12-hour shifts. No other complaint or medical concerns today.  Diabetes Pertinent negatives for hypoglycemia include no dizziness or headaches. Pertinent negatives for diabetes include no chest pain.    Prior to Admission medications   Medication Sig Start Date End Date Taking? Authorizing Provider  Accu-Chek Softclix Lancets lancets 4 (four) times daily. 04/17/20  Yes [provider]  blood glucose meter kit and supplies KIT Dispense based on patient and insurance preference. Use up to four times daily as directed. (FOR ICD-9 250.00, 250.01). 04/16/20  Yes Clarke Peretz, Ines Bloomer, MD  Blood Glucose Monitoring Suppl (CONTOUR NEXT MONITOR) w/Device KIT USE TO CHECK GLUCOSE AS DIRECTED 04/16/20  Yes Tynika Luddy, Ines Bloomer, MD  glucose blood (CONTOUR NEXT TEST) test strip USE  STRIP TO CHECK GLUCOSE 4 TIMES DAILY 04/16/20  Yes Horald Pollen, MD  metFORMIN (GLUCOPHAGE) 1000 MG tablet TAKE 1 TABLET BY MOUTH TWICE DAILY WITH A MEAL 04/16/20  Yes Edy Mcbane, Ines Bloomer, MD  methocarbamol (ROBAXIN) 500 MG tablet Take 1 tablet (500 mg total) by mouth 2 (two) times daily. 03/30/21  Yes Tacy Learn, PA-C  rosuvastatin (CRESTOR) 10 MG tablet Take 1 tablet (10 mg total) by mouth daily. 04/16/20 05/19/21 Yes Nolie Bignell, Ines Bloomer, MD  glipiZIDE (GLUCOTROL) 5 MG tablet Take 1 tablet (5 mg total) by mouth daily before breakfast. 08/14/20 11/12/20  Horald Pollen, MD    Allergies   Allergen Reactions   Percocet [Oxycodone-Acetaminophen] Itching    Patient Active Problem List   Diagnosis Date Noted   Dyslipidemia 07/27/2018   Dyslipidemia associated with type 2 diabetes mellitus ([place]) 12/10/2017    Past Medical History:  Diagnosis Date   Medical history non-contributory    PONV (postoperative nausea and vomiting)     Past Surgical History:  Procedure Laterality Date   ABDOMINAL HYSTERECTOMY N/A 03/31/2013   Procedure: HYSTERECTOMY ABDOMINAL;  Surgeon: Lahoma Crocker, MD;  Location: [facility];  Service: Gynecology;  Laterality: N/A;  fibroids   BILATERAL SALPINGECTOMY Bilateral 03/31/2013   Procedure: BILATERAL SALPINGECTOMY;  Surgeon: Lahoma Crocker, MD;  Location: [facility];  Service: Gynecology;  Laterality: Bilateral;   CESAREAN SECTION      Social History   Socioeconomic History   Marital status: Married    Spouse name: Not on file   Number of children: Not on file   Years of education: Not on file   Highest education level: Not on file  Occupational History   Not on file  Tobacco Use   Smoking status: Never   Smokeless tobacco: Never  Substance and Sexual Activity   Alcohol use: No   Drug use: No   Sexual activity: Not on file  Other Topics Concern   Not on file  Social History Narrative   Not on file   Social Determinants of Health   Financial Resource Strain: Not on file  Food Insecurity: Not on file  Transportation Needs: Not on file  Physical Activity: Not on file  Stress: Not on file  Social Connections: Not on file  Intimate Partner Violence: Not on file    Family History  Problem Relation Age of Onset   Hyperlipidemia Mother    Hypertension Mother    Stroke Father      Review of Systems  Constitutional: Negative.  Negative for chills and fever.  HENT: Negative.  Negative for congestion and sore throat.   Respiratory: Negative.  Negative for cough and shortness of breath.   Cardiovascular: Negative.  Negative for  chest pain and palpitations.  Gastrointestinal:  Negative for abdominal pain, diarrhea, nausea and vomiting.  Genitourinary: Negative.  Negative for dysuria.  Skin: Negative.  Negative for rash.  Neurological: Negative.  Negative for dizziness and headaches.  All other systems reviewed and are negative.  Today's Vitals   05/19/21 0850  BP: 116/70  Pulse: 74  Temp: 98.6 F (37 C)  TempSrc: Oral  SpO2: 98%  Weight: 195 lb (88.5 kg)  Height: '5\' 2"'  (1.575 m)   Body mass index is 35.67 kg/m. Wt Readings from Last 3 Encounters:  05/19/21 195 lb (88.5 kg)  07/24/20 193 lb 3.2 oz (87.6 kg)  04/16/20 193 lb 6.4 oz (87.7 kg)    Physical Exam Vitals reviewed.  Constitutional:      Appearance: Normal appearance. She is obese.  HENT:     Head: Normocephalic.  Eyes:     Extraocular Movements: Extraocular movements intact.     Pupils: Pupils are equal, round, and reactive to light.  Cardiovascular:     Rate and Rhythm: Normal rate and regular rhythm.     Pulses: Normal pulses.     Heart sounds: Normal heart sounds.  Pulmonary:     Effort: Pulmonary effort is normal.     Breath sounds: Normal breath sounds.  Musculoskeletal:        General: Normal range of motion.     Cervical back: Normal range of motion.  Skin:    General: Skin is warm and dry.     Capillary Refill: Capillary refill takes less than 2 seconds.  Neurological:     General: No focal deficit present.     Mental Status: She is alert and oriented to person, place, and time.  Psychiatric:        Mood and Affect: Mood normal.        Behavior: Behavior normal.    Results for orders placed or performed in visit on 05/19/21 (from the past 24 hour(s))  POCT glycosylated hemoglobin (Hb A1C)     Status: Abnormal   Collection Time: 05/19/21  9:02 AM  Result Value Ref Range   Hemoglobin A1C 7.3 (A) 4.0 - 5.6 %   HbA1c POC (<> result, manual entry)     HbA1c, POC (prediabetic range)     HbA1c, POC (controlled diabetic  range)      ASSESSMENT & PLAN: Dyslipidemia associated with type 2 diabetes mellitus ([place]) Uncontrolled diabetes with hemoglobin A1c of 7.3. Continue metformin 1000 mg twice a day. Restart weekly Trulicity 0.98 mg Diet and nutrition discussed. Continue rosuvastatin 10 mg daily. Follow-up in 3 months. Justis was seen today for diabetes.  Diagnoses and all orders for this visit:  Dyslipidemia associated with type 2 diabetes mellitus ([place]) -     POCT glycosylated hemoglobin (Hb A1C) -     Dulaglutide (TRULICITY) 1.19 JY/7.8GN SOPN; Inject 0.75 mg into the skin once a week. -     rosuvastatin (CRESTOR) 10 MG tablet; Take 1 tablet (10 mg total) by  mouth daily. -     Accu-Chek Softclix Lancets lancets; 1 each by Other route 4 (four) times daily.  Need for influenza vaccination -     Flu Vaccine QUAD 81moIM (Fluarix, Fluzone & Alfiuria Quad PF)  Patient Instructions  Diabetes Mellitus and Nutrition, Adult When you have diabetes, or diabetes mellitus, it is very important to have healthy eating habits because your blood sugar (glucose) levels are greatly affected by what you eat and drink. Eating healthy foods in the right amounts, at about the same times every day, can help you: Control your blood glucose. Lower your risk of heart disease. Improve your blood pressure. Reach or maintain a healthy weight. What can affect my meal plan? Every person with diabetes is different, and each person has different needs for a meal plan. Your health care provider may recommend that you work with a dietitian to make a meal plan that is best for you. Your meal plan may vary depending on factors such as: The calories you need. The medicines you take. Your weight. Your blood glucose, blood pressure, and cholesterol levels. Your activity level. Other health conditions you have, such as heart or kidney disease. How do carbohydrates affect me? Carbohydrates, also called carbs, affect your blood glucose  level more than any other type of food. Eating carbs naturally raises the amount of glucose in your blood. Carb counting is a method for keeping track of how many carbs you eat. Counting carbs is important to keep your blood glucose at a healthy level, especially if you use insulin or take certain oral diabetes medicines. It is important to know how many carbs you can safely have in each meal. This is different for every person. Your dietitian can help you calculate how many carbs you should have at each meal and for each snack. How does alcohol affect me? Alcohol can cause a sudden decrease in blood glucose (hypoglycemia), especially if you use insulin or take certain oral diabetes medicines. Hypoglycemia can be a life-threatening condition. Symptoms of hypoglycemia, such as sleepiness, dizziness, and confusion, are similar to symptoms of having too much alcohol. Do not drink alcohol if: Your health care provider tells you not to drink. You are pregnant, may be pregnant, or are planning to become pregnant. If you drink alcohol: Do not drink on an empty stomach. Limit how much you use to: 0-1 drink a day for women. 0-2 drinks a day for men. Be aware of how much alcohol is in your drink. In the [place] one drink equals one 12 oz bottle of beer (355 mL), one 5 oz glass of wine (148 mL), or one 1 oz glass of hard liquor (44 mL). Keep yourself hydrated with water, diet soda, or unsweetened iced tea. Keep in mind that regular soda, juice, and other mixers may contain a lot of sugar and must be counted as carbs. What are tips for following this plan? Reading food labels Start by checking the serving size on the "Nutrition Facts" label of packaged foods and drinks. The amount of calories, carbs, fats, and other nutrients listed on the label is based on one serving of the item. Many items contain more than one serving per package. Check the total grams (g) of carbs in one serving. You can calculate the  number of servings of carbs in one serving by dividing the total carbs by 15. For example, if a food has 30 g of total carbs per serving, it would be equal to 2 servings  of carbs. Check the number of grams (g) of saturated fats and trans fats in one serving. Choose foods that have a low amount or none of these fats. Check the number of milligrams (mg) of salt (sodium) in one serving. Most people should limit total sodium intake to less than 2,300 mg per day. Always check the nutrition information of foods labeled as "low-fat" or "nonfat." These foods may be higher in added sugar or refined carbs and should be avoided. Talk to your dietitian to identify your daily goals for nutrients listed on the label. Shopping Avoid buying canned, pre-made, or processed foods. These foods tend to be high in fat, sodium, and added sugar. Shop around the outside edge of the grocery store. This is where you will most often find fresh fruits and vegetables, bulk grains, fresh meats, and fresh dairy. Cooking Use low-heat cooking methods, such as baking, instead of high-heat cooking methods like deep frying. Cook using healthy oils, such as olive, canola, or sunflower oil. Avoid cooking with butter, cream, or high-fat meats. Meal planning Eat meals and snacks regularly, preferably at the same times every day. Avoid going long periods of time without eating. Eat foods that are high in fiber, such as fresh fruits, vegetables, beans, and whole grains. Talk with your dietitian about how many servings of carbs you can eat at each meal. Eat 4-6 oz (112-168 g) of lean protein each day, such as lean meat, chicken, fish, eggs, or tofu. One ounce (oz) of lean protein is equal to: 1 oz (28 g) of meat, chicken, or fish. 1 egg.  cup (62 g) of tofu. Eat some foods each day that contain healthy fats, such as avocado, nuts, seeds, and fish. What foods should I eat? Fruits Berries. Apples. Oranges. Peaches. Apricots. Plums.  Grapes. Mango. Papaya. Pomegranate. Kiwi. Cherries. Vegetables Lettuce. Spinach. Leafy greens, including kale, chard, collard greens, and mustard greens. Beets. Cauliflower. Cabbage. Broccoli. Carrots. Green beans. Tomatoes. Peppers. Onions. Cucumbers. Brussels sprouts. Grains Whole grains, such as whole-wheat or whole-grain bread, crackers, tortillas, cereal, and pasta. Unsweetened oatmeal. Quinoa. Brown or wild rice. Meats and other proteins Seafood. Poultry without skin. Lean cuts of poultry and beef. Tofu. Nuts. Seeds. Dairy Low-fat or fat-free dairy products such as milk, yogurt, and cheese. The items listed above may not be a complete list of foods and beverages you can eat. Contact a dietitian for more information. What foods should I avoid? Fruits Fruits canned with syrup. Vegetables Canned vegetables. Frozen vegetables with butter or cream sauce. Grains Refined white flour and flour products such as bread, pasta, snack foods, and cereals. Avoid all processed foods. Meats and other proteins Fatty cuts of meat. Poultry with skin. Breaded or fried meats. Processed meat. Avoid saturated fats. Dairy Full-fat yogurt, cheese, or milk. Beverages Sweetened drinks, such as soda or iced tea. The items listed above may not be a complete list of foods and beverages you should avoid. Contact a dietitian for more information. Questions to ask a health care provider Do I need to meet with a diabetes educator? Do I need to meet with a dietitian? What number can I call if I have questions? When are the best times to check my blood glucose? Where to find more information: American Diabetes Association: diabetes.org Academy of Nutrition and Dietetics: www.eatright.[employer] of Diabetes and Digestive and Kidney Diseases: [employer] Association of Diabetes Care and Education Specialists: www.diabeteseducator.org Summary It is important to have healthy eating habits because  your blood sugar (  glucose) levels are greatly affected by what you eat and drink. A healthy meal plan will help you control your blood glucose and maintain a healthy lifestyle. Your health care provider may recommend that you work with a dietitian to make a meal plan that is best for you. Keep in mind that carbohydrates (carbs) and alcohol have immediate effects on your blood glucose levels. It is important to count carbs and to use alcohol carefully. This information is not intended to replace advice given to you by your health care provider. Make sure you discuss any questions you have with your health care provider. Document Revised: 08/01/2019 Document Reviewed: 08/01/2019 Elsevier Patient Education  2021 [place], MD [facility]

## 2021-05-19 NOTE — Patient Instructions (Signed)
Diabetes Mellitus and Nutrition, Adult When you have diabetes, or diabetes mellitus, it is very important to have healthy eating habits because your blood sugar (glucose) levels are greatly affected by what you eat and drink. Eating healthy foods in the right amounts, at about the same times every day, can help you:  Control your blood glucose.  Lower your risk of heart disease.  Improve your blood pressure.  Reach or maintain a healthy weight. What can affect my meal plan? Every person with diabetes is different, and each person has different needs for a meal plan. Your health care provider may recommend that you work with a dietitian to make a meal plan that is best for you. Your meal plan may vary depending on factors such as:  The calories you need.  The medicines you take.  Your weight.  Your blood glucose, blood pressure, and cholesterol levels.  Your activity level.  Other health conditions you have, such as heart or kidney disease. How do carbohydrates affect me? Carbohydrates, also called carbs, affect your blood glucose level more than any other type of food. Eating carbs naturally raises the amount of glucose in your blood. Carb counting is a method for keeping track of how many carbs you eat. Counting carbs is important to keep your blood glucose at a healthy level, especially if you use insulin or take certain oral diabetes medicines. It is important to know how many carbs you can safely have in each meal. This is different for every person. Your dietitian can help you calculate how many carbs you should have at each meal and for each snack. How does alcohol affect me? Alcohol can cause a sudden decrease in blood glucose (hypoglycemia), especially if you use insulin or take certain oral diabetes medicines. Hypoglycemia can be a life-threatening condition. Symptoms of hypoglycemia, such as sleepiness, dizziness, and confusion, are similar to symptoms of having too much  alcohol.  Do not drink alcohol if: ? Your health care provider tells you not to drink. ? You are pregnant, may be pregnant, or are planning to become pregnant.  If you drink alcohol: ? Do not drink on an empty stomach. ? Limit how much you use to:  0-1 drink a day for women.  0-2 drinks a day for men. ? Be aware of how much alcohol is in your drink. In the [place] one drink equals one 12 oz bottle of beer (355 mL), one 5 oz glass of wine (148 mL), or one 1 oz glass of hard liquor (44 mL). ? Keep yourself hydrated with water, diet soda, or unsweetened iced tea.  Keep in mind that regular soda, juice, and other mixers may contain a lot of sugar and must be counted as carbs. What are tips for following this plan? Reading food labels  Start by checking the serving size on the "Nutrition Facts" label of packaged foods and drinks. The amount of calories, carbs, fats, and other nutrients listed on the label is based on one serving of the item. Many items contain more than one serving per package.  Check the total grams (g) of carbs in one serving. You can calculate the number of servings of carbs in one serving by dividing the total carbs by 15. For example, if a food has 30 g of total carbs per serving, it would be equal to 2 servings of carbs.  Check the number of grams (g) of saturated fats and trans fats in one serving. Choose foods that have   a low amount or none of these fats.  Check the number of milligrams (mg) of salt (sodium) in one serving. Most people should limit total sodium intake to less than 2,300 mg per day.  Always check the nutrition information of foods labeled as "low-fat" or "nonfat." These foods may be higher in added sugar or refined carbs and should be avoided.  Talk to your dietitian to identify your daily goals for nutrients listed on the label. Shopping  Avoid buying canned, pre-made, or processed foods. These foods tend to be high in fat, sodium, and added  sugar.  Shop around the outside edge of the grocery store. This is where you will most often find fresh fruits and vegetables, bulk grains, fresh meats, and fresh dairy. Cooking  Use low-heat cooking methods, such as baking, instead of high-heat cooking methods like deep frying.  Cook using healthy oils, such as olive, canola, or sunflower oil.  Avoid cooking with butter, cream, or high-fat meats. Meal planning  Eat meals and snacks regularly, preferably at the same times every day. Avoid going long periods of time without eating.  Eat foods that are high in fiber, such as fresh fruits, vegetables, beans, and whole grains. Talk with your dietitian about how many servings of carbs you can eat at each meal.  Eat 4-6 oz (112-168 g) of lean protein each day, such as lean meat, chicken, fish, eggs, or tofu. One ounce (oz) of lean protein is equal to: ? 1 oz (28 g) of meat, chicken, or fish. ? 1 egg. ?  cup (62 g) of tofu.  Eat some foods each day that contain healthy fats, such as avocado, nuts, seeds, and fish.   What foods should I eat? Fruits Berries. Apples. Oranges. Peaches. Apricots. Plums. Grapes. Mango. Papaya. Pomegranate. Kiwi. Cherries. Vegetables Lettuce. Spinach. Leafy greens, including kale, chard, collard greens, and mustard greens. Beets. Cauliflower. Cabbage. Broccoli. Carrots. Green beans. Tomatoes. Peppers. Onions. Cucumbers. Brussels sprouts. Grains Whole grains, such as whole-wheat or whole-grain bread, crackers, tortillas, cereal, and pasta. Unsweetened oatmeal. Quinoa. Brown or wild rice. Meats and other proteins Seafood. Poultry without skin. Lean cuts of poultry and beef. Tofu. Nuts. Seeds. Dairy Low-fat or fat-free dairy products such as milk, yogurt, and cheese. The items listed above may not be a complete list of foods and beverages you can eat. Contact a dietitian for more information. What foods should I avoid? Fruits Fruits canned with  syrup. Vegetables Canned vegetables. Frozen vegetables with butter or cream sauce. Grains Refined white flour and flour products such as bread, pasta, snack foods, and cereals. Avoid all processed foods. Meats and other proteins Fatty cuts of meat. Poultry with skin. Breaded or fried meats. Processed meat. Avoid saturated fats. Dairy Full-fat yogurt, cheese, or milk. Beverages Sweetened drinks, such as soda or iced tea. The items listed above may not be a complete list of foods and beverages you should avoid. Contact a dietitian for more information. Questions to ask a health care provider  Do I need to meet with a diabetes educator?  Do I need to meet with a dietitian?  What number can I call if I have questions?  When are the best times to check my blood glucose? Where to find more information:  American Diabetes Association: diabetes.org  Academy of Nutrition and Dietetics: www.eatright.org  National Institute of Diabetes and Digestive and Kidney Diseases: www.niddk.nih.gov  Association of Diabetes Care and Education Specialists: www.diabeteseducator.org Summary  It is important to have healthy eating   habits because your blood sugar (glucose) levels are greatly affected by what you eat and drink.  A healthy meal plan will help you control your blood glucose and maintain a healthy lifestyle.  Your health care provider may recommend that you work with a dietitian to make a meal plan that is best for you.  Keep in mind that carbohydrates (carbs) and alcohol have immediate effects on your blood glucose levels. It is important to count carbs and to use alcohol carefully. This information is not intended to replace advice given to you by your health care provider. Make sure you discuss any questions you have with your health care provider. Document Revised: 08/01/2019 Document Reviewed: 08/01/2019 Elsevier Patient Education  2021 Elsevier Inc.  

## 2021-05-20 NOTE — Telephone Encounter (Signed)
Medication was refilled 05/19/21.

## 2021-05-20 NOTE — Telephone Encounter (Signed)
Medication refilled 05/19/21.

## 2021-05-26 ENCOUNTER — Telehealth: Payer: Self-pay | Admitting: Emergency Medicine

## 2021-05-26 DIAGNOSIS — E1169 Type 2 diabetes mellitus with other specified complication: Secondary | ICD-10-CM

## 2021-05-26 DIAGNOSIS — E785 Hyperlipidemia, unspecified: Secondary | ICD-10-CM

## 2021-05-27 NOTE — Telephone Encounter (Signed)
Patient called to check on the status of the refill. Stated she already has the metformin but still needs the Accu-Chek test strips. Per patient the pharmacy has not received the corrected order but the patient has not been able to check for a week now.   Please advise.

## 2021-05-28 ENCOUNTER — Other Ambulatory Visit: Payer: Self-pay

## 2021-05-28 DIAGNOSIS — E1169 Type 2 diabetes mellitus with other specified complication: Secondary | ICD-10-CM

## 2021-05-28 DIAGNOSIS — E785 Hyperlipidemia, unspecified: Secondary | ICD-10-CM

## 2021-05-28 MED ORDER — BLOOD GLUCOSE MONITOR KIT: PACK | 0 refills | Status: AC

## 2021-05-28 NOTE — Telephone Encounter (Signed)
Refilled medication

## 2021-05-29 ENCOUNTER — Telehealth: Payer: Self-pay | Admitting: Emergency Medicine

## 2021-05-29 NOTE — Telephone Encounter (Signed)
1.Medication Requested: glucose blood (Accu-Chek) test strip  2. Pharmacy (Name, [place], [facility]): [place], [place]  Phone:  [phone] Fax:  [phone]   3. On Med List: yes  4. Last Visit with PCP: 09.12.22  5. Next visit date with PCP: n/a   Agent: Please be advised that RX refills may take up to 3 business days. We ask that you follow-up with your pharmacy.

## 2021-06-13 ENCOUNTER — Other Ambulatory Visit: Payer: Self-pay | Admitting: Emergency Medicine

## 2021-06-13 DIAGNOSIS — E1169 Type 2 diabetes mellitus with other specified complication: Secondary | ICD-10-CM

## 2021-06-13 DIAGNOSIS — E785 Hyperlipidemia, unspecified: Secondary | ICD-10-CM

## 2021-06-24 LAB — HM MAMMOGRAPHY

## 2021-07-04 ENCOUNTER — Encounter: Payer: Self-pay | Admitting: Emergency Medicine

## 2021-07-21 ENCOUNTER — Other Ambulatory Visit: Payer: Self-pay | Admitting: Emergency Medicine

## 2021-07-21 DIAGNOSIS — E1169 Type 2 diabetes mellitus with other specified complication: Secondary | ICD-10-CM

## 2021-12-26 ENCOUNTER — Telehealth: Payer: Self-pay | Admitting: *Deleted

## 2021-12-26 NOTE — Telephone Encounter (Signed)
PA approved for Trulicity  ?Key: [place] ?

## 2022-04-08 ENCOUNTER — Other Ambulatory Visit: Payer: Self-pay | Admitting: Emergency Medicine

## 2022-04-08 DIAGNOSIS — E1169 Type 2 diabetes mellitus with other specified complication: Secondary | ICD-10-CM

## 2022-04-27 ENCOUNTER — Other Ambulatory Visit: Payer: Self-pay | Admitting: *Deleted

## 2022-04-27 DIAGNOSIS — E1169 Type 2 diabetes mellitus with other specified complication: Secondary | ICD-10-CM

## 2022-04-27 MED ORDER — METFORMIN HCL 1000 MG PO TABS: ORAL_TABLET | ORAL | 0 refills | Status: DC

## 2022-05-07 ENCOUNTER — Other Ambulatory Visit: Payer: Self-pay | Admitting: Emergency Medicine

## 2022-05-07 DIAGNOSIS — E1169 Type 2 diabetes mellitus with other specified complication: Secondary | ICD-10-CM

## 2022-06-04 ENCOUNTER — Telehealth: Payer: Self-pay

## 2022-06-04 ENCOUNTER — Encounter: Payer: Self-pay | Admitting: Emergency Medicine

## 2022-06-04 ENCOUNTER — Ambulatory Visit (INDEPENDENT_AMBULATORY_CARE_PROVIDER_SITE_OTHER): Payer: BC Managed Care – PPO | Admitting: Emergency Medicine

## 2022-06-04 VITALS — BP 132/76 | HR 79 | Temp 98.4°F | Ht 62.0 in | Wt 191.1 lb

## 2022-06-04 DIAGNOSIS — E785 Hyperlipidemia, unspecified: Secondary | ICD-10-CM | POA: Diagnosis not present

## 2022-06-04 DIAGNOSIS — Z1211 Encounter for screening for malignant neoplasm of colon: Secondary | ICD-10-CM

## 2022-06-04 DIAGNOSIS — Z13228 Encounter for screening for other metabolic disorders: Secondary | ICD-10-CM

## 2022-06-04 DIAGNOSIS — Z1329 Encounter for screening for other suspected endocrine disorder: Secondary | ICD-10-CM | POA: Diagnosis not present

## 2022-06-04 DIAGNOSIS — Z13220 Encounter for screening for lipoid disorders: Secondary | ICD-10-CM | POA: Diagnosis not present

## 2022-06-04 DIAGNOSIS — Z130 Encounter for screening for diseases of the blood and blood-forming organs and certain disorders involving the immune mechanism: Secondary | ICD-10-CM | POA: Diagnosis not present

## 2022-06-04 DIAGNOSIS — Z23 Encounter for immunization: Secondary | ICD-10-CM | POA: Diagnosis not present

## 2022-06-04 DIAGNOSIS — E1169 Type 2 diabetes mellitus with other specified complication: Secondary | ICD-10-CM

## 2022-06-04 DIAGNOSIS — Z1159 Encounter for screening for other viral diseases: Secondary | ICD-10-CM

## 2022-06-04 DIAGNOSIS — E1165 Type 2 diabetes mellitus with hyperglycemia: Secondary | ICD-10-CM | POA: Diagnosis not present

## 2022-06-04 DIAGNOSIS — Z0001 Encounter for general adult medical examination with abnormal findings: Secondary | ICD-10-CM

## 2022-06-04 LAB — COMPREHENSIVE METABOLIC PANEL
ALT: 11 U/L (ref 0–35)
AST: 14 U/L (ref 0–37)
Albumin: 4.3 g/dL (ref 3.5–5.2)
Alkaline Phosphatase: 49 U/L (ref 39–117)
BUN: 12 mg/dL (ref 6–23)
CO2: 27 mEq/L (ref 19–32)
Calcium: 9.5 mg/dL (ref 8.4–10.5)
Chloride: 103 mEq/L (ref 96–112)
Creatinine, Ser: 0.71 mg/dL (ref 0.40–1.20)
GFR: 97.66 mL/min (ref >60.00)
Glucose, Bld: 143 mg/dL — ABNORMAL HIGH (ref 70–99)
Potassium: 4 mEq/L (ref 3.5–5.1)
Sodium: 138 mEq/L (ref 135–145)
Total Bilirubin: 0.4 mg/dL (ref 0.2–1.2)
Total Protein: 7.3 g/dL (ref 6.0–8.3)

## 2022-06-04 LAB — CBC WITH DIFFERENTIAL/PLATELET
Basophils Absolute: 0 10*3/uL (ref 0.0–0.1)
Basophils Relative: 0.5 % (ref 0.0–3.0)
Eosinophils Absolute: 0.1 10*3/uL (ref 0.0–0.7)
Eosinophils Relative: 1.1 % (ref 0.0–5.0)
HCT: 37.7 % (ref 36.0–46.0)
Hemoglobin: 12.7 g/dL (ref 12.0–15.0)
Lymphocytes Relative: 56.9 % — ABNORMAL HIGH (ref 12.0–46.0)
Lymphs Abs: 3.2 10*3/uL (ref 0.7–4.0)
MCHC: 33.6 g/dL (ref 30.0–36.0)
MCV: 91.6 fl (ref 78.0–100.0)
Monocytes Absolute: 0.4 10*3/uL (ref 0.1–1.0)
Monocytes Relative: 7.8 % (ref 3.0–12.0)
Neutro Abs: 1.9 10*3/uL (ref 1.4–7.7)
Neutrophils Relative %: 33.7 % — ABNORMAL LOW (ref 43.0–77.0)
Platelets: 358 10*3/uL (ref 150.0–400.0)
RBC: 4.12 Mil/uL (ref 3.87–5.11)
RDW: 12.6 % (ref 11.5–15.5)
WBC: 5.7 10*3/uL (ref 4.0–10.5)

## 2022-06-04 LAB — LIPID PANEL
Cholesterol: 128 mg/dL (ref 0–200)
HDL: 50 mg/dL (ref >39.00)
LDL Cholesterol: 64 mg/dL (ref 0–99)
NonHDL: 77.65
Total CHOL/HDL Ratio: 3
Triglycerides: 69 mg/dL (ref 0.0–149.0)
VLDL: 13.8 mg/dL (ref 0.0–40.0)

## 2022-06-04 LAB — MICROALBUMIN / CREATININE URINE RATIO
Creatinine,U: 108.6 mg/dL
Microalb Creat Ratio: 0.6 mg/g (ref 0.0–30.0)
Microalb, Ur: <0.7 mg/dL (ref 0.0–1.9)

## 2022-06-04 LAB — POCT GLYCOSYLATED HEMOGLOBIN (HGB A1C): Hemoglobin A1C: 7.5 % — AB (ref 4.0–5.6)

## 2022-06-04 MED ORDER — TIRZEPATIDE 5 MG/0.5ML ~~LOC~~ SOAJ
5.0000 mg | SUBCUTANEOUS | 3 refills | Status: DC
Start: 2022-06-04 — End: 2023-01-18

## 2022-06-04 MED ORDER — METFORMIN HCL 1000 MG PO TABS: ORAL_TABLET | ORAL | 0 refills | Status: DC

## 2022-06-04 MED ORDER — ROSUVASTATIN CALCIUM 10 MG PO TABS: 10.0000 mg | ORAL_TABLET | Freq: Every day | ORAL | 3 refills | Status: DC

## 2022-06-04 NOTE — Telephone Encounter (Signed)
Patient needs a prior auth for tirzepatide [facility]) 5 MG/0.5ML Pen

## 2022-06-04 NOTE — Assessment & Plan Note (Signed)
Hemoglobin A1c still not at goal, 7.5. Not on Trulicity. Continue metformin 1000 mg twice a day Recommend to start Mounjaro 5 mg weekly. Cardiovascular risk associated with hypertension discussed. Diet and nutrition discussed. We will follow-up in 3 months.

## 2022-06-04 NOTE — Patient Instructions (Signed)

## 2022-06-04 NOTE — Telephone Encounter (Signed)
PA for [facility] approved  Key: [id number]

## 2022-06-04 NOTE — Progress Notes (Signed)
Kerri Sims 51 y.o.   Chief Complaint  Patient presents with   Annual Exam    No concerns     HISTORY OF PRESENT ILLNESS: This is a 51 y.o. female here for annual exam. History of diabetes on metformin.  Stopped Trulicity about 4 to 5 months ago No complaints or medical concerns today.  HPI   Prior to Admission medications   Medication Sig Start Date End Date Taking? Authorizing Provider  ACCU-CHEK GUIDE test strip USE 1 STRIP TO CHECK GLUCOSE 4 TIMES DAILY 05/07/22  Yes Abiageal Blowe, Ines Bloomer, MD  Accu-Chek Softclix Lancets lancets 1 each by Other route 4 (four) times daily. 05/19/21  Yes Jeremaine Maraj, Ines Bloomer, MD  blood glucose meter kit and supplies KIT Dispense based on patient and insurance preference. Use up to four times daily as directed. (FOR ICD-9 250.00, 250.01). 05/28/21  Yes Zyaira Vejar, Ines Bloomer, MD  metFORMIN (GLUCOPHAGE) 1000 MG tablet TAKE 1 TABLET BY MOUTH TWICE DAILY WITH A MEAL 04/27/22  Yes Carlotta Telfair, Ines Bloomer, MD  methocarbamol (ROBAXIN) 500 MG tablet Take 1 tablet (500 mg total) by mouth 2 (two) times daily. 03/30/21  Yes Tacy Learn, PA-C  Blood Glucose Monitoring Suppl (CONTOUR NEXT MONITOR) w/Device KIT USE TO CHECK GLUCOSE AS DIRECTED 04/16/20   Horald Pollen, MD  rosuvastatin (CRESTOR) 10 MG tablet Take 1 tablet (10 mg total) by mouth daily. 05/19/21 08/17/21  Horald Pollen, MD    Allergies  Allergen Reactions   Percocet [Oxycodone-Acetaminophen] Itching    Patient Active Problem List   Diagnosis Date Noted   Dyslipidemia 07/27/2018   Dyslipidemia associated with type 2 diabetes mellitus ([place]) 12/10/2017    Past Medical History:  Diagnosis Date   Medical history non-contributory    PONV (postoperative nausea and vomiting)     Past Surgical History:  Procedure Laterality Date   ABDOMINAL HYSTERECTOMY N/A 03/31/2013   Procedure: HYSTERECTOMY ABDOMINAL;  Surgeon: Lahoma Crocker, MD;  Location: [facility];  Service:  Gynecology;  Laterality: N/A;  fibroids   BILATERAL SALPINGECTOMY Bilateral 03/31/2013   Procedure: BILATERAL SALPINGECTOMY;  Surgeon: Lahoma Crocker, MD;  Location: [facility];  Service: Gynecology;  Laterality: Bilateral;   CESAREAN SECTION      Social History   Socioeconomic History   Marital status: Married    Spouse name: Not on file   Number of children: Not on file   Years of education: Not on file   Highest education level: Not on file  Occupational History   Not on file  Tobacco Use   Smoking status: Never   Smokeless tobacco: Never  Substance and Sexual Activity   Alcohol use: No   Drug use: No   Sexual activity: Not on file  Other Topics Concern   Not on file  Social History Narrative   Not on file   Social Determinants of Health   Financial Resource Strain: Not on file  Food Insecurity: Not on file  Transportation Needs: Not on file  Physical Activity: Not on file  Stress: Not on file  Social Connections: Not on file  Intimate Partner Violence: Not on file    Family History  Problem Relation Age of Onset   Hyperlipidemia Mother    Hypertension Mother    Stroke Father      Review of Systems  Constitutional: Negative.  Negative for chills and fever.  HENT: Negative.  Negative for congestion and sore throat.   Eyes: Negative.   Respiratory: Negative.  Negative for  cough and shortness of breath.   Cardiovascular: Negative.  Negative for chest pain.  Gastrointestinal: Negative.  Negative for abdominal pain, nausea and vomiting.  Genitourinary: Negative.  Negative for dysuria and hematuria.  Skin: Negative.  Negative for rash.  Neurological: Negative.  Negative for dizziness and headaches.  All other systems reviewed and are negative.   Today's Vitals   06/04/22 0952  BP: 132/76  Pulse: 79  Temp: 98.4 F (36.9 C)  TempSrc: Oral  SpO2: 96%  Weight: 191 lb 2 oz (86.7 kg)  Height: '5\' 2"'  (1.575 m)   Body mass index is 34.96 kg/m. Wt Readings  from Last 3 Encounters:  06/04/22 191 lb 2 oz (86.7 kg)  05/19/21 195 lb (88.5 kg)  07/24/20 193 lb 3.2 oz (87.6 kg)   Lab Results  Component Value Date   HGBA1C 7.3 (A) 05/19/2021    Physical Exam Vitals reviewed.  Constitutional:      Appearance: Normal appearance.  HENT:     Head: Normocephalic.     Right Ear: Tympanic membrane, ear canal and external ear normal.     Left Ear: Tympanic membrane, ear canal and external ear normal.     Mouth/Throat:     Mouth: Mucous membranes are moist.     Pharynx: Oropharynx is clear.  Eyes:     Extraocular Movements: Extraocular movements intact.     Conjunctiva/sclera: Conjunctivae normal.     Pupils: Pupils are equal, round, and reactive to light.  Cardiovascular:     Rate and Rhythm: Normal rate and regular rhythm.     Pulses: Normal pulses.     Heart sounds: Normal heart sounds.  Pulmonary:     Effort: Pulmonary effort is normal.     Breath sounds: Normal breath sounds.  Abdominal:     Palpations: Abdomen is soft.     Tenderness: There is no abdominal tenderness.  Musculoskeletal:        General: Normal range of motion.     Cervical back: No tenderness.     Right lower leg: No edema.     Left lower leg: No edema.  Lymphadenopathy:     Cervical: No cervical adenopathy.  Skin:    General: Skin is warm and dry.     Capillary Refill: Capillary refill takes less than 2 seconds.  Neurological:     General: No focal deficit present.     Mental Status: She is alert and oriented to person, place, and time.  Psychiatric:        Mood and Affect: Mood normal.        Behavior: Behavior normal.   Results for orders placed or performed in visit on 06/04/22 (from the past 24 hour(s))  POCT HgB A1C     Status: Abnormal   Collection Time: 06/04/22 10:30 AM  Result Value Ref Range   Hemoglobin A1C 7.5 (A) 4.0 - 5.6 %   HbA1c POC (<> result, manual entry)     HbA1c, POC (prediabetic range)     HbA1c, POC (controlled diabetic range)        ASSESSMENT & PLAN: Problem List Items Addressed This Visit       Endocrine   Dyslipidemia associated with type 2 diabetes mellitus (HCC)    Hemoglobin A1c still not at goal, 7.5. Not on Trulicity. Continue metformin 1000 mg twice a day Recommend to start Mounjaro 5 mg weekly. Cardiovascular risk associated with hypertension discussed. Diet and nutrition discussed. We will follow-up in 3 months.  Relevant Medications   rosuvastatin (CRESTOR) 10 MG tablet   metFORMIN (GLUCOPHAGE) 1000 MG tablet   tirzepatide (MOUNJARO) 5 MG/0.5ML Pen   Other Relevant Orders   POCT HgB A1C (Completed)   Urine Microalbumin w/creat. ratio   Comprehensive metabolic panel   Lipid panel   Other Visit Diagnoses     Encounter for general adult medical examination with abnormal findings    -  Primary   Need for vaccination       Relevant Orders   Flu Vaccine QUAD 6+ mos PF IM (Fluarix Quad PF) (Completed)   Need for hepatitis C screening test       Relevant Orders   Hepatitis C antibody screen   Colon cancer screening       Relevant Orders   Ambulatory referral to Gastroenterology   Screening for deficiency anemia       Relevant Orders   CBC with Differential   Screening for lipoid disorders       Relevant Orders   Lipid panel   Screening for endocrine, metabolic and immunity disorder       Relevant Orders   Comprehensive metabolic panel   Uncontrolled type 2 diabetes mellitus with hyperglycemia (HCC)       Relevant Medications   rosuvastatin (CRESTOR) 10 MG tablet   metFORMIN (GLUCOPHAGE) 1000 MG tablet   tirzepatide (MOUNJARO) 5 MG/0.5ML Pen      Modifiable risk factors discussed with patient. Anticipatory guidance according to age provided. The following topics were also discussed: Social Determinants of Health Smoking.  Non-smoker Diet and nutrition and need to decrease amount of daily carbohydrate intake and daily calories and increase amount of plant based protein in  her diet Benefits of exercise Cancer screening and need for colon cancer screening with colonoscopy Vaccinations reviewed and recommendations Cardiovascular risk assessment Diagnosis of uncontrolled diabetes and medication management Mental health including depression and anxiety Fall and accident prevention  Patient Instructions  Health Maintenance, Female Adopting a healthy lifestyle and getting preventive care are important in promoting health and wellness. Ask your health care provider about: The right schedule for you to have regular tests and exams. Things you can do on your own to prevent diseases and keep yourself healthy. What should I know about diet, weight, and exercise? Eat a healthy diet  Eat a diet that includes plenty of vegetables, fruits, low-fat dairy products, and lean protein. Do not eat a lot of foods that are high in solid fats, added sugars, or sodium. Maintain a healthy weight Body mass index (BMI) is used to identify weight problems. It estimates body fat based on height and weight. Your health care provider can help determine your BMI and help you achieve or maintain a healthy weight. Get regular exercise Get regular exercise. This is one of the most important things you can do for your health. Most adults should: Exercise for at least 150 minutes each week. The exercise should increase your heart rate and make you sweat (moderate-intensity exercise). Do strengthening exercises at least twice a week. This is in addition to the moderate-intensity exercise. Spend less time sitting. Even light physical activity can be beneficial. Watch cholesterol and blood lipids Have your blood tested for lipids and cholesterol at 52 years of age, then have this test every 5 years. Have your cholesterol levels checked more often if: Your lipid or cholesterol levels are high. You are older than 52 years of age. You are at high risk for heart disease. What should  I know about  cancer screening? Depending on your health history and family history, you may need to have cancer screening at various ages. This may include screening for: Breast cancer. Cervical cancer. Colorectal cancer. Skin cancer. Lung cancer. What should I know about heart disease, diabetes, and high blood pressure? Blood pressure and heart disease High blood pressure causes heart disease and increases the risk of stroke. This is more likely to develop in people who have high blood pressure readings or are overweight. Have your blood pressure checked: Every 3-5 years if you are 65-45 years of age. Every year if you are 12 years old or older. Diabetes Have regular diabetes screenings. This checks your fasting blood sugar level. Have the screening done: Once every three years after age 20 if you are at a normal weight and have a low risk for diabetes. More often and at a younger age if you are overweight or have a high risk for diabetes. What should I know about preventing infection? Hepatitis B If you have a higher risk for hepatitis B, you should be screened for this virus. Talk with your health care provider to find out if you are at risk for hepatitis B infection. Hepatitis C Testing is recommended for: Everyone born from [birth year] through 1966. Anyone with known risk factors for hepatitis C. Sexually transmitted infections (STIs) Get screened for STIs, including gonorrhea and chlamydia, if: You are sexually active and are younger than 52 years of age. You are older than 52 years of age and your health care provider tells you that you are at risk for this type of infection. Your sexual activity has changed since you were last screened, and you are at increased risk for chlamydia or gonorrhea. Ask your health care provider if you are at risk. Ask your health care provider about whether you are at high risk for HIV. Your health care provider may recommend a prescription medicine to help prevent HIV  infection. If you choose to take medicine to prevent HIV, you should first get tested for HIV. You should then be tested every 3 months for as long as you are taking the medicine. Pregnancy If you are about to stop having your period (premenopausal) and you may become pregnant, seek counseling before you get pregnant. Take 400 to 800 micrograms (mcg) of folic acid every day if you become pregnant. Ask for birth control (contraception) if you want to prevent pregnancy. Osteoporosis and menopause Osteoporosis is a disease in which the bones lose minerals and strength with aging. This can result in bone fractures. If you are 83 years old or older, or if you are at risk for osteoporosis and fractures, ask your health care provider if you should: Be screened for bone loss. Take a calcium or vitamin D supplement to lower your risk of fractures. Be given hormone replacement therapy (HRT) to treat symptoms of menopause. Follow these instructions at home: Alcohol use Do not drink alcohol if: Your health care provider tells you not to drink. You are pregnant, may be pregnant, or are planning to become pregnant. If you drink alcohol: Limit how much you have to: 0-1 drink a day. Know how much alcohol is in your drink. In the [place] one drink equals one 12 oz bottle of beer (355 mL), one 5 oz glass of wine (148 mL), or one 1 oz glass of hard liquor (44 mL). Lifestyle Do not use any products that contain nicotine or tobacco. These products include cigarettes, chewing tobacco,  and vaping devices, such as e-cigarettes. If you need help quitting, ask your health care provider. Do not use street drugs. Do not share needles. Ask your health care provider for help if you need support or information about quitting drugs. General instructions Schedule regular health, dental, and eye exams. Stay current with your vaccines. Tell your health care provider if: You often feel depressed. You have ever been abused  or do not feel safe at home. Summary Adopting a healthy lifestyle and getting preventive care are important in promoting health and wellness. Follow your health care provider's instructions about healthy diet, exercising, and getting tested or screened for diseases. Follow your health care provider's instructions on monitoring your cholesterol and blood pressure. This information is not intended to replace advice given to you by your health care provider. Make sure you discuss any questions you have with your health care provider. Document Revised: 01/13/2021 Document Reviewed: 01/13/2021 Elsevier Patient Education  [place], MD [facility]

## 2022-06-05 LAB — HEPATITIS C ANTIBODY: Hepatitis C Ab: NONREACTIVE

## 2022-06-11 ENCOUNTER — Telehealth: Payer: Self-pay | Admitting: Emergency Medicine

## 2022-06-11 MED ORDER — METHOCARBAMOL 500 MG PO TABS: 500.0000 mg | ORAL_TABLET | Freq: Two times a day (BID) | ORAL | 1 refills | Status: AC

## 2022-06-11 NOTE — Telephone Encounter (Signed)
Patient is requesting her robaxin - she would like it sent to [facility]

## 2022-06-11 NOTE — Telephone Encounter (Signed)
Ok this is done 

## 2022-07-01 ENCOUNTER — Other Ambulatory Visit: Payer: Self-pay | Admitting: Emergency Medicine

## 2022-07-01 DIAGNOSIS — E785 Hyperlipidemia, unspecified: Secondary | ICD-10-CM

## 2022-07-02 ENCOUNTER — Other Ambulatory Visit: Payer: Self-pay | Admitting: Emergency Medicine

## 2022-07-02 DIAGNOSIS — E1169 Type 2 diabetes mellitus with other specified complication: Secondary | ICD-10-CM

## 2022-08-16 ENCOUNTER — Other Ambulatory Visit: Payer: Self-pay | Admitting: Emergency Medicine

## 2022-08-16 DIAGNOSIS — E1169 Type 2 diabetes mellitus with other specified complication: Secondary | ICD-10-CM

## 2022-09-23 ENCOUNTER — Ambulatory Visit: Payer: BC Managed Care – PPO | Admitting: Emergency Medicine

## 2022-10-06 ENCOUNTER — Encounter: Payer: Self-pay | Admitting: Emergency Medicine

## 2022-10-06 ENCOUNTER — Ambulatory Visit: Payer: BC Managed Care – PPO | Admitting: Emergency Medicine

## 2022-10-06 VITALS — BP 128/74 | HR 83 | Temp 98.4°F | Ht 62.0 in | Wt 195.2 lb

## 2022-10-06 DIAGNOSIS — E1169 Type 2 diabetes mellitus with other specified complication: Secondary | ICD-10-CM | POA: Diagnosis not present

## 2022-10-06 DIAGNOSIS — Z1211 Encounter for screening for malignant neoplasm of colon: Secondary | ICD-10-CM

## 2022-10-06 DIAGNOSIS — E785 Hyperlipidemia, unspecified: Secondary | ICD-10-CM | POA: Diagnosis not present

## 2022-10-06 LAB — POCT GLYCOSYLATED HEMOGLOBIN (HGB A1C): Hemoglobin A1C: 5.7 % — AB (ref 4.0–5.6)

## 2022-10-06 NOTE — Patient Instructions (Signed)

## 2022-10-06 NOTE — Progress Notes (Signed)
Kerri Sims 52 y.o.   Chief Complaint  Patient presents with   Follow-up    F/u appt, no concerns     HISTORY OF PRESENT ILLNESS: This is a 52 y.o. female here for 70-monthfollow-up of diabetes and dyslipidemia. Presently on metformin and rosuvastatin.  Also taking weekly Mounjaro.  Tolerating it well. Eating better. Has no complaints or medical concerns today.   HPI   Prior to Admission medications   Medication Sig Start Date End Date Taking? Authorizing Provider  ACCU-CHEK GUIDE test strip USE  STRIP TO CHECK GLUCOSE 4 TIMES DAILY 08/17/22  Yes Kerri Sims, Kerri Bloomer MD  Accu-Chek Softclix Lancets lancets 1 each by Other route 4 (four) times daily. 05/19/21  Yes Kerri Sims, Kerri Bloomer MD  blood glucose meter kit and supplies KIT Dispense based on patient and insurance preference. Use up to four times daily as directed. (FOR ICD-9 250.00, 250.01). 05/28/21  Yes Kerri Sims, Kerri Bloomer MD  Blood Glucose Monitoring Suppl (CONTOUR NEXT MONITOR) w/Device KIT USE TO CHECK GLUCOSE AS DIRECTED 04/16/20  Yes Kerri Sims, Kerri Bloomer MD  metFORMIN (GLUCOPHAGE) 1000 MG tablet TAKE 1 TABLET BY MOUTH TWICE DAILY WITH A MEAL . 07/01/22  Yes Kerri Sims, Kerri Bloomer MD  methocarbamol (ROBAXIN) 500 MG tablet Take 1 tablet (500 mg total) by mouth 2 (two) times daily. 06/11/22  Yes Kerri Borg MD  rosuvastatin (CRESTOR) 10 MG tablet Take 1 tablet (10 mg total) by mouth daily. 06/04/22 05/30/23 Yes Kerri Sims, Kerri Bloomer MD  tirzepatide ([facility] 5 MG/0.5ML Pen Inject 5 mg into the skin once a week. 06/04/22  Yes Kerri Pollen MD    Allergies  Allergen Reactions   Percocet [Oxycodone-Acetaminophen] Itching    Patient Active Problem List   Diagnosis Date Noted   Dyslipidemia 07/27/2018   Dyslipidemia associated with type 2 diabetes mellitus ([place] 12/10/2017    Past Medical History:  Diagnosis Date   Medical history non-contributory    PONV (postoperative nausea and vomiting)      Past Surgical History:  Procedure Laterality Date   ABDOMINAL HYSTERECTOMY N/A 03/31/2013   Procedure: HYSTERECTOMY ABDOMINAL;  Surgeon: Kerri Crocker MD;  [facility];  Service: Gynecology;  Laterality: N/A;  fibroids   BILATERAL SALPINGECTOMY Bilateral 03/31/2013   Procedure: BILATERAL SALPINGECTOMY;  Surgeon: Kerri Crocker MD;  [facility];  Service: Gynecology;  Laterality: Bilateral;   CESAREAN SECTION      Social History   Socioeconomic History   Marital status: Married    Spouse name: Not on file   Number of children: Not on file   Years of education: Not on file   Highest education level: Not on file  Occupational History   Not on file  Tobacco Use   Smoking status: Never   Smokeless tobacco: Never  Substance and Sexual Activity   Alcohol use: No   Drug use: No   Sexual activity: Not on file  Other Topics Concern   Not on file  Social History Narrative   Not on file   Social Determinants of Health   Financial Resource Strain: Not on file  Food Insecurity: Not on file  Transportation Needs: Not on file  Physical Activity: Not on file  Stress: Not on file  Social Connections: Not on file  Intimate Partner Violence: Not on file    Family History  Problem Relation Age of Onset   Hyperlipidemia Mother    Hypertension Mother    Stroke Father      Review  of Systems  Constitutional: Negative.  Negative for chills and fever.  HENT: Negative.  Negative for congestion and sore throat.   Respiratory: Negative.  Negative for cough and shortness of breath.   Cardiovascular: Negative.  Negative for chest pain and palpitations.  Gastrointestinal:  Negative for abdominal pain, diarrhea, nausea and vomiting.  Genitourinary: Negative.  Negative for dysuria and hematuria.  Skin: Negative.  Negative for rash.  Neurological: Negative.  Negative for dizziness and headaches.  All other systems reviewed and are negative.   Today's Vitals    10/06/22 0927  BP: 128/74  Pulse: 83  Temp: 98.4 F (36.9 C)  TempSrc: Oral  SpO2: 98%  Weight: 195 lb 4 oz (88.6 kg)  Height: '5\' 2"'$  (1.575 m)   Body mass index is 35.71 kg/m. Wt Readings from Last 3 Encounters:  10/06/22 195 lb 4 oz (88.6 kg)  06/04/22 191 lb 2 oz (86.7 kg)  05/19/21 195 lb (88.5 kg)    Physical Exam Vitals reviewed.  Constitutional:      Appearance: Normal appearance.  HENT:     Head: Normocephalic.     Mouth/Throat:     Mouth: Mucous membranes are moist.     Pharynx: Oropharynx is clear.  Eyes:     Extraocular Movements: Extraocular movements intact.     Conjunctiva/sclera: Conjunctivae normal.     Pupils: Pupils are equal, round, and reactive to light.  Cardiovascular:     Rate and Rhythm: Normal rate and regular rhythm.     Pulses: Normal pulses.     Heart sounds: Normal heart sounds.  Pulmonary:     Effort: Pulmonary effort is normal.     Breath sounds: Normal breath sounds.  Musculoskeletal:        General: Normal range of motion.  Skin:    General: Skin is warm and dry.  Neurological:     General: No focal deficit present.     Mental Status: She is alert and oriented to person, place, and time.  Psychiatric:        Mood and Affect: Mood normal.        Behavior: Behavior normal.    Results for orders placed or performed in visit on 10/06/22 (from the past 24 hour(s))  POCT glycosylated hemoglobin (Hb A1C)     Status: Abnormal   Collection Time: 10/06/22 10:14 AM  Result Value Ref Range   Hemoglobin A1C 5.7 (A) 4.0 - 5.6 %   HbA1c POC (<> result, manual entry)     HbA1c, POC (prediabetic range)     HbA1c, POC (controlled diabetic range)       ASSESSMENT & PLAN: A total of 42 minutes was spent with the patient and counseling/coordination of care regarding preparing for this visit, review of most recent office visit notes, review of most recent blood work results including today's interpretation of hemoglobin A1c, cardiovascular risks  associated with diabetes and dyslipidemia, review of all medications, education on nutrition, prognosis, documentation and need for follow-up in 3 months.  Problem List Items Addressed This Visit       Endocrine   Dyslipidemia associated with type 2 diabetes mellitus ([place]) - Primary    Well-controlled diabetes with hemoglobin A1c of 5.7. Continue metformin 1000 mg twice a day and weekly Mounjaro 5 mg. Cardiovascular risk associated with diabetes and dyslipidemia discussed Diet and nutrition discussed Continue rosuvastatin 10 mg daily. Follow-up in 3 months.      Relevant Orders   POCT glycosylated hemoglobin (Hb  A1C)   Other Visit Diagnoses     Colon cancer screening       Relevant Orders   Cologuard      Patient Instructions  Diabetes Mellitus and Nutrition, Adult When you have diabetes, or diabetes mellitus, it is very important to have healthy eating habits because your blood sugar (glucose) levels are greatly affected by what you eat and drink. Eating healthy foods in the right amounts, at about the same times every day, can help you: Manage your blood glucose. Lower your risk of heart disease. Improve your blood pressure. Reach or maintain a healthy weight. What can affect my meal plan? Every person with diabetes is different, and each person has different needs for a meal plan. Your health care provider may recommend that you work with a dietitian to make a meal plan that is best for you. Your meal plan may vary depending on factors such as: The calories you need. The medicines you take. Your weight. Your blood glucose, blood pressure, and cholesterol levels. Your activity level. Other health conditions you have, such as heart or kidney disease. How do carbohydrates affect me? Carbohydrates, also called carbs, affect your blood glucose level more than any other type of food. Eating carbs raises the amount of glucose in your blood. It is important to know how many  carbs you can safely have in each meal. This is different for every person. Your dietitian can help you calculate how many carbs you should have at each meal and for each snack. How does alcohol affect me? Alcohol can cause a decrease in blood glucose (hypoglycemia), especially if you use insulin or take certain diabetes medicines by mouth. Hypoglycemia can be a life-threatening condition. Symptoms of hypoglycemia, such as sleepiness, dizziness, and confusion, are similar to symptoms of having too much alcohol. Do not drink alcohol if: Your health care provider tells you not to drink. You are pregnant, may be pregnant, or are planning to become pregnant. If you drink alcohol: Limit how much you have to: 0-1 drink a day for women. 0-2 drinks a day for men. Know how much alcohol is in your drink. In the [place] one drink equals one 12 oz bottle of beer (355 mL), one 5 oz glass of wine (148 mL), or one 1 oz glass of hard liquor (44 mL). Keep yourself hydrated with water, diet soda, or unsweetened iced tea. Keep in mind that regular soda, juice, and other mixers may contain a lot of sugar and must be counted as carbs. What are tips for following this plan?  Reading food labels Start by checking the serving size on the Nutrition Facts label of packaged foods and drinks. The number of calories and the amount of carbs, fats, and other nutrients listed on the label are based on one serving of the item. Many items contain more than one serving per package. Check the total grams (g) of carbs in one serving. Check the number of grams of saturated fats and trans fats in one serving. Choose foods that have a low amount or none of these fats. Check the number of milligrams (mg) of salt (sodium) in one serving. Most people should limit total sodium intake to less than 2,300 mg per day. Always check the nutrition information of foods labeled as "low-fat" or "nonfat." These foods may be higher in added sugar or  refined carbs and should be avoided. Talk to your dietitian to identify your daily goals for nutrients listed on the label.  Shopping Avoid buying canned, pre-made, or processed foods. These foods tend to be high in fat, sodium, and added sugar. Shop around the outside edge of the grocery store. This is where you will most often find fresh fruits and vegetables, bulk grains, fresh meats, and fresh dairy products. Cooking Use low-heat cooking methods, such as baking, instead of high-heat cooking methods, such as deep frying. Cook using healthy oils, such as olive, canola, or sunflower oil. Avoid cooking with butter, cream, or high-fat meats. Meal planning Eat meals and snacks regularly, preferably at the same times every day. Avoid going long periods of time without eating. Eat foods that are high in fiber, such as fresh fruits, vegetables, beans, and whole grains. Eat 4-6 oz (112-168 g) of lean protein each day, such as lean meat, chicken, fish, eggs, or tofu. One ounce (oz) (28 g) of lean protein is equal to: 1 oz (28 g) of meat, chicken, or fish. 1 egg.  cup (62 g) of tofu. Eat some foods each day that contain healthy fats, such as avocado, nuts, seeds, and fish. What foods should I eat? Fruits Berries. Apples. Oranges. Peaches. Apricots. Plums. Grapes. Mangoes. Papayas. Pomegranates. Kiwi. Cherries. Vegetables Leafy greens, including lettuce, spinach, kale, chard, collard greens, mustard greens, and cabbage. Beets. Cauliflower. Broccoli. Carrots. Green beans. Tomatoes. Peppers. Onions. Cucumbers. Brussels sprouts. Grains Whole grains, such as whole-wheat or whole-grain bread, crackers, tortillas, cereal, and pasta. Unsweetened oatmeal. Quinoa. Brown or wild rice. Meats and other proteins Seafood. Poultry without skin. Lean cuts of poultry and beef. Tofu. Nuts. Seeds. Dairy Low-fat or fat-free dairy products such as milk, yogurt, and cheese. The items listed above may not be a complete  list of foods and beverages you can eat and drink. Contact a dietitian for more information. What foods should I avoid? Fruits Fruits canned with syrup. Vegetables Canned vegetables. Frozen vegetables with butter or cream sauce. Grains Refined white flour and flour products such as bread, pasta, snack foods, and cereals. Avoid all processed foods. Meats and other proteins Fatty cuts of meat. Poultry with skin. Breaded or fried meats. Processed meat. Avoid saturated fats. Dairy Full-fat yogurt, cheese, or milk. Beverages Sweetened drinks, such as soda or iced tea. The items listed above may not be a complete list of foods and beverages you should avoid. Contact a dietitian for more information. Questions to ask a health care provider Do I need to meet with a certified diabetes care and education specialist? Do I need to meet with a dietitian? What number can I call if I have questions? When are the best times to check my blood glucose? Where to find more information: American Diabetes Association: diabetes.org Academy of Nutrition and Dietetics: eatright.[employer] of Diabetes and Digestive and Kidney Diseases: [employer] Association of Diabetes Care & Education Specialists: diabeteseducator.org Summary It is important to have healthy eating habits because your blood sugar (glucose) levels are greatly affected by what you eat and drink. It is important to use alcohol carefully. A healthy meal plan will help you manage your blood glucose and lower your risk of heart disease. Your health care provider may recommend that you work with a dietitian to make a meal plan that is best for you. This information is not intended to replace advice given to you by your health care provider. Make sure you discuss any questions you have with your health care provider. Document Revised: 03/27/2020 Document Reviewed: 03/27/2020 Elsevier Patient Education  Eva  Mitchel Honour, MD [facility]

## 2022-10-06 NOTE — Assessment & Plan Note (Signed)
Well-controlled diabetes with hemoglobin A1c of 5.7. Continue metformin 1000 mg twice a day and weekly Mounjaro 5 mg. Cardiovascular risk associated with diabetes and dyslipidemia discussed Diet and nutrition discussed Continue rosuvastatin 10 mg daily. Follow-up in 3 months.

## 2022-10-12 DIAGNOSIS — Z1211 Encounter for screening for malignant neoplasm of colon: Secondary | ICD-10-CM | POA: Diagnosis not present

## 2022-10-22 LAB — COLOGUARD: COLOGUARD: NEGATIVE

## 2022-10-25 ENCOUNTER — Other Ambulatory Visit: Payer: Self-pay | Admitting: Emergency Medicine

## 2022-10-25 DIAGNOSIS — E1169 Type 2 diabetes mellitus with other specified complication: Secondary | ICD-10-CM

## 2022-12-02 ENCOUNTER — Other Ambulatory Visit: Payer: Self-pay | Admitting: Emergency Medicine

## 2022-12-02 DIAGNOSIS — E1169 Type 2 diabetes mellitus with other specified complication: Secondary | ICD-10-CM

## 2022-12-21 ENCOUNTER — Other Ambulatory Visit: Payer: Self-pay | Admitting: Emergency Medicine

## 2022-12-21 DIAGNOSIS — E1169 Type 2 diabetes mellitus with other specified complication: Secondary | ICD-10-CM

## 2023-01-15 ENCOUNTER — Telehealth: Payer: Self-pay | Admitting: Emergency Medicine

## 2023-01-15 NOTE — Telephone Encounter (Signed)
Pt call her Kerri Sims is out of stock pt was considering some alternatives.

## 2023-01-17 NOTE — Telephone Encounter (Signed)
Depending on insurance coverage, the usual alternatives like Ozempic and Trulicity.

## 2023-01-18 ENCOUNTER — Other Ambulatory Visit: Payer: Self-pay | Admitting: Emergency Medicine

## 2023-01-18 DIAGNOSIS — E785 Hyperlipidemia, unspecified: Secondary | ICD-10-CM

## 2023-01-18 MED ORDER — SEMAGLUTIDE (1 MG/DOSE) 4 MG/3ML ~~LOC~~ SOPN: 1.0000 mg | PEN_INJECTOR | SUBCUTANEOUS | 5 refills | Status: DC

## 2023-01-18 NOTE — Telephone Encounter (Signed)
New prescription for Ozempic sent to pharmacy of record today.  Thanks.

## 2023-01-20 ENCOUNTER — Other Ambulatory Visit (HOSPITAL_COMMUNITY): Payer: Self-pay

## 2023-01-20 ENCOUNTER — Telehealth: Payer: Self-pay

## 2023-01-20 NOTE — Telephone Encounter (Signed)
Pharmacy Patient Advocate Encounter   Received notification that prior authorization for Ozempic is required/requested.   Key or (Medicaid) confirmation # [id number] Status is processing for clinical questions.

## 2023-01-20 NOTE — Telephone Encounter (Signed)
Patient Advocate Encounter  Prior Authorization for Ozempic has been approved with [employer].    PA# [id number] Effective dates: 12/21/22 through 01/20/24  Per [employer] test claim, copay for 28 days supply is refill too soon, filled 01/20/23 at retail pharmacy.

## 2023-01-21 NOTE — Telephone Encounter (Signed)
Called patient and informed her the her PA was approved

## 2023-01-22 ENCOUNTER — Other Ambulatory Visit: Payer: Self-pay | Admitting: Emergency Medicine

## 2023-01-22 DIAGNOSIS — E1169 Type 2 diabetes mellitus with other specified complication: Secondary | ICD-10-CM

## 2023-02-22 ENCOUNTER — Other Ambulatory Visit: Payer: Self-pay | Admitting: Emergency Medicine

## 2023-02-22 DIAGNOSIS — E1169 Type 2 diabetes mellitus with other specified complication: Secondary | ICD-10-CM

## 2023-03-15 ENCOUNTER — Encounter: Payer: Self-pay | Admitting: Emergency Medicine

## 2023-03-15 ENCOUNTER — Ambulatory Visit: Payer: BC Managed Care – PPO | Admitting: Emergency Medicine

## 2023-03-15 VITALS — BP 128/74 | HR 78 | Temp 98.3°F | Ht 62.0 in | Wt 178.0 lb

## 2023-03-15 DIAGNOSIS — E1169 Type 2 diabetes mellitus with other specified complication: Secondary | ICD-10-CM

## 2023-03-15 DIAGNOSIS — Z111 Encounter for screening for respiratory tuberculosis: Secondary | ICD-10-CM | POA: Diagnosis not present

## 2023-03-15 DIAGNOSIS — Z789 Other specified health status: Secondary | ICD-10-CM | POA: Diagnosis not present

## 2023-03-15 DIAGNOSIS — E785 Hyperlipidemia, unspecified: Secondary | ICD-10-CM | POA: Diagnosis not present

## 2023-03-15 DIAGNOSIS — Z7985 Long-term (current) use of injectable non-insulin antidiabetic drugs: Secondary | ICD-10-CM

## 2023-03-15 DIAGNOSIS — Z7984 Long term (current) use of oral hypoglycemic drugs: Secondary | ICD-10-CM

## 2023-03-15 LAB — LIPID PANEL
Cholesterol: 163 mg/dL (ref 0–200)
HDL: 54.1 mg/dL (ref >39.00)
LDL Cholesterol: 96 mg/dL (ref 0–99)
NonHDL: 108.77
Total CHOL/HDL Ratio: 3
Triglycerides: 66 mg/dL (ref 0.0–149.0)
VLDL: 13.2 mg/dL (ref 0.0–40.0)

## 2023-03-15 LAB — COMPREHENSIVE METABOLIC PANEL
ALT: 8 U/L (ref 0–35)
AST: 13 U/L (ref 0–37)
Albumin: 4.3 g/dL (ref 3.5–5.2)
Alkaline Phosphatase: 44 U/L (ref 39–117)
BUN: 11 mg/dL (ref 6–23)
CO2: 27 mEq/L (ref 19–32)
Calcium: 9.9 mg/dL (ref 8.4–10.5)
Chloride: 103 mEq/L (ref 96–112)
Creatinine, Ser: 0.71 mg/dL (ref 0.40–1.20)
GFR: 97.12 mL/min (ref >60.00)
Glucose, Bld: 104 mg/dL — ABNORMAL HIGH (ref 70–99)
Potassium: 4.2 mEq/L (ref 3.5–5.1)
Sodium: 140 mEq/L (ref 135–145)
Total Bilirubin: 0.4 mg/dL (ref 0.2–1.2)
Total Protein: 7.7 g/dL (ref 6.0–8.3)

## 2023-03-15 LAB — MICROALBUMIN / CREATININE URINE RATIO
Creatinine,U: 116.1 mg/dL
Microalb Creat Ratio: 0.6 mg/g (ref 0.0–30.0)
Microalb, Ur: <0.7 mg/dL (ref 0.0–1.9)

## 2023-03-15 LAB — CBC WITH DIFFERENTIAL/PLATELET
Basophils Absolute: 0 10*3/uL (ref 0.0–0.1)
Basophils Relative: 0.9 % (ref 0.0–3.0)
Eosinophils Absolute: 0 10*3/uL (ref 0.0–0.7)
Eosinophils Relative: 0.8 % (ref 0.0–5.0)
HCT: 40.7 % (ref 36.0–46.0)
Hemoglobin: 13.5 g/dL (ref 12.0–15.0)
Lymphocytes Relative: 57.4 % — ABNORMAL HIGH (ref 12.0–46.0)
Lymphs Abs: 2.6 10*3/uL (ref 0.7–4.0)
MCHC: 33.1 g/dL (ref 30.0–36.0)
MCV: 92.3 fl (ref 78.0–100.0)
Monocytes Absolute: 0.3 10*3/uL (ref 0.1–1.0)
Monocytes Relative: 6.7 % (ref 3.0–12.0)
Neutro Abs: 1.5 10*3/uL (ref 1.4–7.7)
Neutrophils Relative %: 34.2 % — ABNORMAL LOW (ref 43.0–77.0)
Platelets: 360 10*3/uL (ref 150.0–400.0)
RBC: 4.41 Mil/uL (ref 3.87–5.11)
RDW: 13.1 % (ref 11.5–15.5)
WBC: 4.5 10*3/uL (ref 4.0–10.5)

## 2023-03-15 LAB — POCT GLYCOSYLATED HEMOGLOBIN (HGB A1C): Hemoglobin A1C: 5.9 % — AB (ref 4.0–5.6)

## 2023-03-15 NOTE — Progress Notes (Signed)
Denora Ingham 52 y.o.   Chief Complaint  Patient presents with   Medical Management of Chronic Issues    f/u appt, no concerns, patient states she wants to get a Hep B titer and quantiferon gold lab     HISTORY OF PRESENT ILLNESS: This is a 52 y.o. female here for 59-month follow-up of chronic medical conditions including diabetes and dyslipidemia Overall doing well.  Has no complaints or medical concerns today. Lab Results  Component Value Date   HGBA1C 5.7 (A) 10/06/2022   Wt Readings from Last 3 Encounters:  03/15/23 178 lb (80.7 kg)  10/06/22 195 lb 4 oz (88.6 kg)  06/04/22 191 lb 2 oz (86.7 kg)     HPI   Prior to Admission medications   Medication Sig Start Date End Date Taking? Authorizing Provider  Accu-Chek Softclix Lancets lancets 1 each by Other route 4 (four) times daily. 05/19/21  Yes Debar Plate, Eilleen Kempf, MD  blood glucose meter kit and supplies KIT Dispense based on patient and insurance preference. Use up to four times daily as directed. (FOR ICD-9 250.00, 250.01). 05/28/21  Yes Susane Bey, Eilleen Kempf, MD  Blood Glucose Monitoring Suppl (CONTOUR NEXT MONITOR) w/Device KIT USE TO CHECK GLUCOSE AS DIRECTED 04/16/20  Yes Katelinn Justice, Eilleen Kempf, MD  metFORMIN (GLUCOPHAGE) 1000 MG tablet TAKE 1 TABLET BY MOUTH TWICE DAILY WITH A MEAL . 07/01/22  Yes Sharyn Brilliant, Eilleen Kempf, MD  methocarbamol (ROBAXIN) 500 MG tablet Take 1 tablet (500 mg total) by mouth 2 (two) times daily. 06/11/22  Yes Corwin Levins, MD  rosuvastatin (CRESTOR) 10 MG tablet Take 1 tablet (10 mg total) by mouth daily. 06/04/22 05/30/23 Yes Tyshawn Ciullo, Eilleen Kempf, MD  Semaglutide, 1 MG/DOSE, 4 MG/3ML SOPN Inject 1 mg as directed once a week. 01/18/23  Yes Georgina Quint, MD  ACCU-CHEK GUIDE test strip USE  4 TIMES DAILY 02/22/23   Georgina Quint, MD    Allergies  Allergen Reactions   Percocet [Oxycodone-Acetaminophen] Itching    Patient Active Problem List   Diagnosis Date Noted    Dyslipidemia 07/27/2018   Dyslipidemia associated with type 2 diabetes mellitus (HCC) 12/10/2017    Past Medical History:  Diagnosis Date   Medical history non-contributory    PONV (postoperative nausea and vomiting)     Past Surgical History:  Procedure Laterality Date   ABDOMINAL HYSTERECTOMY N/A 03/31/2013   Procedure: HYSTERECTOMY ABDOMINAL;  Surgeon: Antionette Char, MD;  Location: [facility];  Service: Gynecology;  Laterality: N/A;  fibroids   BILATERAL SALPINGECTOMY Bilateral 03/31/2013   Procedure: BILATERAL SALPINGECTOMY;  Surgeon: Antionette Char, MD;  Location: [facility];  Service: Gynecology;  Laterality: Bilateral;   CESAREAN SECTION      Social History   Socioeconomic History   Marital status: Married    Spouse name: Not on file   Number of children: Not on file   Years of education: Not on file   Highest education level: Not on file  Occupational History   Not on file  Tobacco Use   Smoking status: Never   Smokeless tobacco: Never  Substance and Sexual Activity   Alcohol use: No   Drug use: No   Sexual activity: Not on file  Other Topics Concern   Not on file  Social History Narrative   Not on file   Social Determinants of Health   Financial Resource Strain: Not on file  Food Insecurity: Not on file  Transportation Needs: Not on file  Physical Activity: Not  on file  Stress: Not on file  Social Connections: Not on file  Intimate Partner Violence: Not on file    Family History  Problem Relation Age of Onset   Hyperlipidemia Mother    Hypertension Mother    Stroke Father      Review of Systems  Constitutional: Negative.  Negative for chills and fever.  HENT: Negative.  Negative for congestion and sore throat.   Respiratory: Negative.  Negative for cough and shortness of breath.   Cardiovascular: Negative.  Negative for chest pain and palpitations.  Gastrointestinal:  Negative for abdominal pain, diarrhea, nausea and vomiting.  Genitourinary:  Negative.  Negative for dysuria and hematuria.  Skin: Negative.  Negative for rash.  Neurological: Negative.  Negative for dizziness and headaches.  All other systems reviewed and are negative.   Vitals:   03/15/23 1014  BP: 128/74  Pulse: 78  Temp: 98.3 F (36.8 C)  SpO2: 98%    Physical Exam Vitals reviewed.  Constitutional:      Appearance: Normal appearance.  HENT:     Head: Normocephalic.     Mouth/Throat:     Mouth: Mucous membranes are moist.     Pharynx: Oropharynx is clear.  Eyes:     Extraocular Movements: Extraocular movements intact.     Conjunctiva/sclera: Conjunctivae normal.     Pupils: Pupils are equal, round, and reactive to light.  Cardiovascular:     Rate and Rhythm: Normal rate and regular rhythm.     Pulses: Normal pulses.     Heart sounds: Normal heart sounds.  Pulmonary:     Effort: Pulmonary effort is normal.     Breath sounds: Normal breath sounds.  Musculoskeletal:     Cervical back: No tenderness.  Lymphadenopathy:     Cervical: No cervical adenopathy.  Skin:    General: Skin is warm and dry.     Capillary Refill: Capillary refill takes less than 2 seconds.  Neurological:     General: No focal deficit present.     Mental Status: She is alert and oriented to person, place, and time.  Psychiatric:        Mood and Affect: Mood normal.        Behavior: Behavior normal.    Results for orders placed or performed in visit on 03/15/23 (from the past 24 hour(s))  POCT HgB A1C     Status: Abnormal   Collection Time: 03/15/23 10:47 AM  Result Value Ref Range   Hemoglobin A1C 5.9 (A) 4.0 - 5.6 %   HbA1c POC (<> result, manual entry)     HbA1c, POC (prediabetic range)     HbA1c, POC (controlled diabetic range)       ASSESSMENT & PLAN: A total of 43 minutes was spent with the patient and counseling/coordination of care regarding preparing for this visit, review of most recent office visit notes, review of most recent blood work results  including interpretation of today's hemoglobin A1c, cardiovascular risks associated with diabetes and dyslipidemia, review of all medications, education on nutrition, prognosis, documentation and need for follow-up.  Problem List Items Addressed This Visit       Endocrine   Dyslipidemia associated with type 2 diabetes mellitus (HCC) - Primary    Well-controlled diabetes with hemoglobin A1c of 5.9 Continue Ozempic 1 mg weekly and metformin 1000 mg twice a day Continue rosuvastatin 10 mg daily Cardiovascular risks associated with diabetes and dyslipidemia discussed Diet and nutrition discussed Blood work done today. Follow-up in  6 months      Relevant Orders   POCT HgB A1C (Completed)   CBC with Differential/Platelet   Comprehensive metabolic panel   Lipid panel   Urine Microalbumin w/creat. ratio   Other Visit Diagnoses     Hepatitis B vaccination status unknown       Relevant Orders   HBsAb Quant HBIG Assessment   Tuberculosis screening       Relevant Orders   QuantiFERON-TB Gold Plus      Patient Instructions  Diabetes Mellitus and Nutrition, Adult When you have diabetes, or diabetes mellitus, it is very important to have healthy eating habits because your blood sugar (glucose) levels are greatly affected by what you eat and drink. Eating healthy foods in the right amounts, at about the same times every day, can help you: Manage your blood glucose. Lower your risk of heart disease. Improve your blood pressure. Reach or maintain a healthy weight. What can affect my meal plan? Every person with diabetes is different, and each person has different needs for a meal plan. Your health care provider may recommend that you work with a dietitian to make a meal plan that is best for you. Your meal plan may vary depending on factors such as: The calories you need. The medicines you take. Your weight. Your blood glucose, blood pressure, and cholesterol levels. Your activity  level. Other health conditions you have, such as heart or kidney disease. How do carbohydrates affect me? Carbohydrates, also called carbs, affect your blood glucose level more than any other type of food. Eating carbs raises the amount of glucose in your blood. It is important to know how many carbs you can safely have in each meal. This is different for every person. Your dietitian can help you calculate how many carbs you should have at each meal and for each snack. How does alcohol affect me? Alcohol can cause a decrease in blood glucose (hypoglycemia), especially if you use insulin or take certain diabetes medicines by mouth. Hypoglycemia can be a life-threatening condition. Symptoms of hypoglycemia, such as sleepiness, dizziness, and confusion, are similar to symptoms of having too much alcohol. Do not drink alcohol if: Your health care provider tells you not to drink. You are pregnant, may be pregnant, or are planning to become pregnant. If you drink alcohol: Limit how much you have to: 0-1 drink a day for women. 0-2 drinks a day for men. Know how much alcohol is in your drink. In the [place] one drink equals one 12 oz bottle of beer (355 mL), one 5 oz glass of wine (148 mL), or one 1 oz glass of hard liquor (44 mL). Keep yourself hydrated with water, diet soda, or unsweetened iced tea. Keep in mind that regular soda, juice, and other mixers may contain a lot of sugar and must be counted as carbs. What are tips for following this plan?  Reading food labels Start by checking the serving size on the Nutrition Facts label of packaged foods and drinks. The number of calories and the amount of carbs, fats, and other nutrients listed on the label are based on one serving of the item. Many items contain more than one serving per package. Check the total grams (g) of carbs in one serving. Check the number of grams of saturated fats and trans fats in one serving. Choose foods that have a low amount  or none of these fats. Check the number of milligrams (mg) of salt (sodium) in one serving.  Most people should limit total sodium intake to less than 2,300 mg per day. Always check the nutrition information of foods labeled as "low-fat" or "nonfat." These foods may be higher in added sugar or refined carbs and should be avoided. Talk to your dietitian to identify your daily goals for nutrients listed on the label. Shopping Avoid buying canned, pre-made, or processed foods. These foods tend to be high in fat, sodium, and added sugar. Shop around the outside edge of the grocery store. This is where you will most often find fresh fruits and vegetables, bulk grains, fresh meats, and fresh dairy products. Cooking Use low-heat cooking methods, such as baking, instead of high-heat cooking methods, such as deep frying. Cook using healthy oils, such as olive, canola, or sunflower oil. Avoid cooking with butter, cream, or high-fat meats. Meal planning Eat meals and snacks regularly, preferably at the same times every day. Avoid going long periods of time without eating. Eat foods that are high in fiber, such as fresh fruits, vegetables, beans, and whole grains. Eat 4-6 oz (112-168 g) of lean protein each day, such as lean meat, chicken, fish, eggs, or tofu. One ounce (oz) (28 g) of lean protein is equal to: 1 oz (28 g) of meat, chicken, or fish. 1 egg.  cup (62 g) of tofu. Eat some foods each day that contain healthy fats, such as avocado, nuts, seeds, and fish. What foods should I eat? Fruits Berries. Apples. Oranges. Peaches. Apricots. Plums. Grapes. Mangoes. Papayas. Pomegranates. Kiwi. Cherries. Vegetables Leafy greens, including lettuce, spinach, kale, chard, collard greens, mustard greens, and cabbage. Beets. Cauliflower. Broccoli. Carrots. Green beans. Tomatoes. Peppers. Onions. Cucumbers. Brussels sprouts. Grains Whole grains, such as whole-wheat or whole-grain bread, crackers, tortillas,  cereal, and pasta. Unsweetened oatmeal. Quinoa. Brown or wild rice. Meats and other proteins Seafood. Poultry without skin. Lean cuts of poultry and beef. Tofu. Nuts. Seeds. Dairy Low-fat or fat-free dairy products such as milk, yogurt, and cheese. The items listed above may not be a complete list of foods and beverages you can eat and drink. Contact a dietitian for more information. What foods should I avoid? Fruits Fruits canned with syrup. Vegetables Canned vegetables. Frozen vegetables with butter or cream sauce. Grains Refined white flour and flour products such as bread, pasta, snack foods, and cereals. Avoid all processed foods. Meats and other proteins Fatty cuts of meat. Poultry with skin. Breaded or fried meats. Processed meat. Avoid saturated fats. Dairy Full-fat yogurt, cheese, or milk. Beverages Sweetened drinks, such as soda or iced tea. The items listed above may not be a complete list of foods and beverages you should avoid. Contact a dietitian for more information. Questions to ask a health care provider Do I need to meet with a certified diabetes care and education specialist? Do I need to meet with a dietitian? What number can I call if I have questions? When are the best times to check my blood glucose? Where to find more information: American Diabetes Association: diabetes.org Academy of Nutrition and Dietetics: eatright.[employer] of Diabetes and Digestive and Kidney Diseases: StageSync.si Association of Diabetes Care & Education Specialists: diabeteseducator.org Summary It is important to have healthy eating habits because your blood sugar (glucose) levels are greatly affected by what you eat and drink. It is important to use alcohol carefully. A healthy meal plan will help you manage your blood glucose and lower your risk of heart disease. Your health care provider may recommend that you work with a dietitian  to make a meal plan that is best for  you. This information is not intended to replace advice given to you by your health care provider. Make sure you discuss any questions you have with your health care provider. Document Revised: 03/27/2020 Document Reviewed: 03/27/2020 Elsevier Patient Education  2024 Elsevier Inc.      Edwina Barth, MD [facility]

## 2023-03-15 NOTE — Assessment & Plan Note (Signed)
Well-controlled diabetes with hemoglobin A1c of 5.9 Continue Ozempic 1 mg weekly and metformin 1000 mg twice a day Continue rosuvastatin 10 mg daily Cardiovascular risks associated with diabetes and dyslipidemia discussed Diet and nutrition discussed Blood work done today. Follow-up in 6 months

## 2023-03-15 NOTE — Patient Instructions (Signed)

## 2023-03-16 LAB — HEPATITIS B SURFACE ANTIBODY, QUANTITATIVE: Hep B S AB Quant (Post): 507 m[IU]/mL (ref >=10)

## 2023-03-18 LAB — QUANTIFERON-TB GOLD PLUS
QuantiFERON Mitogen Value: >10 IU/mL
QuantiFERON Nil Value: 0.03 IU/mL
QuantiFERON TB1 Ag Value: 0.04 IU/mL
QuantiFERON TB2 Ag Value: 0.04 IU/mL
QuantiFERON-TB Gold Plus: NEGATIVE

## 2023-03-26 ENCOUNTER — Other Ambulatory Visit: Payer: Self-pay | Admitting: Emergency Medicine

## 2023-03-26 DIAGNOSIS — E1169 Type 2 diabetes mellitus with other specified complication: Secondary | ICD-10-CM

## 2023-04-15 ENCOUNTER — Other Ambulatory Visit: Payer: Self-pay | Admitting: Emergency Medicine

## 2023-04-15 DIAGNOSIS — E1169 Type 2 diabetes mellitus with other specified complication: Secondary | ICD-10-CM

## 2023-04-22 ENCOUNTER — Telehealth: Payer: Self-pay | Admitting: Pharmacy Technician

## 2023-04-22 ENCOUNTER — Other Ambulatory Visit (HOSPITAL_COMMUNITY): Payer: Self-pay

## 2023-04-22 NOTE — Telephone Encounter (Signed)
Pharmacy Patient Advocate Encounter   Received notification from CoverMyMeds that prior authorization for Accu-Check Guide is required/requested.   Insurance verification completed.   The patient is insured through [employer] .   Per test claim:  Freestyle is preferred by the ins.  If suggested medication is appropriate, Please send in a new RX and discontinue this one. If not, please advise as to why it's not appropriate so that we may request a Prior Authorization.

## 2023-05-13 ENCOUNTER — Ambulatory Visit (INDEPENDENT_AMBULATORY_CARE_PROVIDER_SITE_OTHER): Payer: BC Managed Care – PPO

## 2023-05-13 DIAGNOSIS — Z23 Encounter for immunization: Secondary | ICD-10-CM | POA: Diagnosis not present

## 2023-05-22 ENCOUNTER — Other Ambulatory Visit: Payer: Self-pay | Admitting: Emergency Medicine

## 2023-05-22 DIAGNOSIS — E1169 Type 2 diabetes mellitus with other specified complication: Secondary | ICD-10-CM

## 2023-06-15 ENCOUNTER — Other Ambulatory Visit: Payer: Self-pay | Admitting: Emergency Medicine

## 2023-06-15 DIAGNOSIS — E1169 Type 2 diabetes mellitus with other specified complication: Secondary | ICD-10-CM

## 2023-06-20 ENCOUNTER — Other Ambulatory Visit: Payer: Self-pay | Admitting: Emergency Medicine

## 2023-06-20 DIAGNOSIS — E1169 Type 2 diabetes mellitus with other specified complication: Secondary | ICD-10-CM

## 2023-07-14 ENCOUNTER — Telehealth: Payer: Self-pay | Admitting: Emergency Medicine

## 2023-07-14 ENCOUNTER — Other Ambulatory Visit: Payer: Self-pay | Admitting: Emergency Medicine

## 2023-07-14 DIAGNOSIS — E1169 Type 2 diabetes mellitus with other specified complication: Secondary | ICD-10-CM
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
## 2023-07-14 NOTE — Telephone Encounter (Signed)
Patient called and said she doesn't think her OZEMPIC, 1 MG/DOSE, 4 MG/3ML SOPN is working as well as it should. She wanted to know if she could change what she is taking. Patient would like a call back at [phone].

## 2023-07-16 ENCOUNTER — Encounter: Payer: Self-pay | Admitting: *Deleted

## 2023-07-16 NOTE — Telephone Encounter (Signed)
Pt stating that the Ozempic is not working for her and she asking if she can go back to the [place]. Please advise.

## 2023-07-19 ENCOUNTER — Other Ambulatory Visit: Payer: Self-pay | Admitting: Emergency Medicine

## 2023-07-19 DIAGNOSIS — E1169 Type 2 diabetes mellitus with other specified complication: Secondary | ICD-10-CM

## 2023-07-19 MED ORDER — TIRZEPATIDE 7.5 MG/0.5ML ~~LOC~~ SOAJ: 7.5000 mg | SUBCUTANEOUS | 3 refills | Status: DC

## 2023-07-19 NOTE — Telephone Encounter (Signed)
Okay to change to Mounjaro 7.5 mg weekly

## 2023-07-19 NOTE — Telephone Encounter (Signed)
Patient needs a PA started for her [facility]

## 2023-07-26 ENCOUNTER — Telehealth: Payer: Self-pay

## 2023-07-26 ENCOUNTER — Encounter: Payer: Self-pay | Admitting: Radiology

## 2023-07-26 ENCOUNTER — Other Ambulatory Visit (HOSPITAL_COMMUNITY): Payer: Self-pay

## 2023-07-26 NOTE — Telephone Encounter (Signed)
Pharmacy Patient Advocate Encounter   Received notification from Patient Advice Request messages that prior authorization for Mounjaro 7.5MG /0.5ML auto-injectors is required/requested.   Insurance verification completed.   The patient is insured through [employer] .   Per test claim: APPROVED from 06/26/23 to 07/25/24. Ran test claim, Copay is $25. This test claim was processed through [facility]- copay amounts may vary at other pharmacies due to pharmacy/plan contracts, or as the patient moves through the different stages of their insurance plan.    Key: [id number]

## 2023-08-17 ENCOUNTER — Other Ambulatory Visit (HOSPITAL_COMMUNITY): Payer: Self-pay

## 2024-05-26 ENCOUNTER — Other Ambulatory Visit: Payer: Self-pay | Admitting: Emergency Medicine

## 2024-05-26 DIAGNOSIS — E785 Hyperlipidemia, unspecified: Secondary | ICD-10-CM

## 2024-05-31 ENCOUNTER — Ambulatory Visit (INDEPENDENT_AMBULATORY_CARE_PROVIDER_SITE_OTHER): Admitting: Emergency Medicine

## 2024-05-31 ENCOUNTER — Encounter: Payer: Self-pay | Admitting: Emergency Medicine

## 2024-05-31 VITALS — BP 114/82 | HR 91 | Temp 98.5°F | Ht 62.0 in | Wt 181.0 lb

## 2024-05-31 DIAGNOSIS — E785 Hyperlipidemia, unspecified: Secondary | ICD-10-CM

## 2024-05-31 DIAGNOSIS — Z0000 Encounter for general adult medical examination without abnormal findings: Secondary | ICD-10-CM | POA: Diagnosis not present

## 2024-05-31 DIAGNOSIS — Z1329 Encounter for screening for other suspected endocrine disorder: Secondary | ICD-10-CM | POA: Diagnosis not present

## 2024-05-31 DIAGNOSIS — E1169 Type 2 diabetes mellitus with other specified complication: Secondary | ICD-10-CM | POA: Diagnosis not present

## 2024-05-31 DIAGNOSIS — Z130 Encounter for screening for diseases of the blood and blood-forming organs and certain disorders involving the immune mechanism: Secondary | ICD-10-CM

## 2024-05-31 DIAGNOSIS — Z7985 Long-term (current) use of injectable non-insulin antidiabetic drugs: Secondary | ICD-10-CM | POA: Diagnosis not present

## 2024-05-31 DIAGNOSIS — Z1231 Encounter for screening mammogram for malignant neoplasm of breast: Secondary | ICD-10-CM

## 2024-05-31 DIAGNOSIS — Z0001 Encounter for general adult medical examination with abnormal findings: Secondary | ICD-10-CM

## 2024-05-31 DIAGNOSIS — Z13228 Encounter for screening for other metabolic disorders: Secondary | ICD-10-CM

## 2024-05-31 DIAGNOSIS — Z23 Encounter for immunization: Secondary | ICD-10-CM | POA: Diagnosis not present

## 2024-05-31 LAB — COMPREHENSIVE METABOLIC PANEL WITH GFR
ALT: 11 U/L (ref 0–35)
AST: 15 U/L (ref 0–37)
Albumin: 4.4 g/dL (ref 3.5–5.2)
Alkaline Phosphatase: 52 U/L (ref 39–117)
BUN: 13 mg/dL (ref 6–23)
CO2: 27 meq/L (ref 19–32)
Calcium: 9.5 mg/dL (ref 8.4–10.5)
Chloride: 105 meq/L (ref 96–112)
Creatinine, Ser: 0.75 mg/dL (ref 0.40–1.20)
GFR: 90.17 mL/min (ref >60.00)
Glucose, Bld: 95 mg/dL (ref 70–99)
Potassium: 4 meq/L (ref 3.5–5.1)
Sodium: 140 meq/L (ref 135–145)
Total Bilirubin: 0.5 mg/dL (ref 0.2–1.2)
Total Protein: 7.6 g/dL (ref 6.0–8.3)

## 2024-05-31 LAB — LIPID PANEL
Cholesterol: 207 mg/dL — ABNORMAL HIGH (ref 0–200)
HDL: 53.4 mg/dL (ref >39.00)
LDL Cholesterol: 139 mg/dL — ABNORMAL HIGH (ref 0–99)
NonHDL: 153.41
Total CHOL/HDL Ratio: 4
Triglycerides: 70 mg/dL (ref 0.0–149.0)
VLDL: 14 mg/dL (ref 0.0–40.0)

## 2024-05-31 LAB — CBC WITH DIFFERENTIAL/PLATELET
Basophils Absolute: 0.1 K/uL (ref 0.0–0.1)
Basophils Relative: 1 % (ref 0.0–3.0)
Eosinophils Absolute: 0 K/uL (ref 0.0–0.7)
Eosinophils Relative: 0.7 % (ref 0.0–5.0)
HCT: 39.9 % (ref 36.0–46.0)
Hemoglobin: 13.1 g/dL (ref 12.0–15.0)
Lymphocytes Relative: 55.2 % — ABNORMAL HIGH (ref 12.0–46.0)
Lymphs Abs: 2.9 K/uL (ref 0.7–4.0)
MCHC: 32.9 g/dL (ref 30.0–36.0)
MCV: 91 fl (ref 78.0–100.0)
Monocytes Absolute: 0.4 K/uL (ref 0.1–1.0)
Monocytes Relative: 8.4 % (ref 3.0–12.0)
Neutro Abs: 1.8 K/uL (ref 1.4–7.7)
Neutrophils Relative %: 34.7 % — ABNORMAL LOW (ref 43.0–77.0)
Platelets: 367 K/uL (ref 150.0–400.0)
RBC: 4.38 Mil/uL (ref 3.87–5.11)
RDW: 13.1 % (ref 11.5–15.5)
WBC: 5.2 K/uL (ref 4.0–10.5)

## 2024-05-31 LAB — POCT GLYCOSYLATED HEMOGLOBIN (HGB A1C): Hemoglobin A1C: 5.9 % — AB (ref 4.0–5.6)

## 2024-05-31 NOTE — Assessment & Plan Note (Signed)
 Well-controlled diabetes with A1c of 5.9 Continues Mounjaro  7.5 mg weekly along with metformin  1000 mg twice a day Also taking rosuvastatin  10 mg daily Diet and nutrition discussed Cardiovascular risks associated with diabetes and dyslipidemia discussed

## 2024-05-31 NOTE — Patient Instructions (Signed)

## 2024-05-31 NOTE — Progress Notes (Signed)
 Kerri Sims 53 y.o.   Chief Complaint  Patient presents with   Annual Exam    Patient here for physical. No other concerns    HISTORY OF PRESENT ILLNESS: This is a 53 y.o. female here for annual exam  History of diabetes and dyslipidemia On Mounjaro , metformin , and rosuvastatin  Registered nurse, works night shifts. Wt Readings from Last 3 Encounters:  03/15/23 178 lb (80.7 kg)  10/06/22 195 lb 4 oz (88.6 kg)  06/04/22 191 lb 2 oz (86.7 kg)     HPI   Prior to Admission medications   Medication Sig Start Date End Date Taking? Authorizing Provider  ACCU-CHEK GUIDE test strip USE  STRIP TO CHECK GLUCOSE 4 TIMES DAILY 06/15/23  Yes Riot Waterworth, Emil Schanz, MD  Accu-Chek Softclix Lancets lancets 1 each by Other route 4 (four) times daily. 05/19/21  Yes Ameliah Baskins, Emil Schanz, MD  blood glucose meter kit and supplies KIT Dispense based on patient and insurance preference. Use up to four times daily as directed. (FOR ICD-9 250.00, 250.01). 05/28/21  Yes Deadrick Stidd, Emil Schanz, MD  Blood Glucose Monitoring Suppl (CONTOUR NEXT MONITOR) w/Device KIT USE TO CHECK GLUCOSE AS DIRECTED 04/16/20  Yes Shatori Bertucci, Emil Schanz, MD  metFORMIN  (GLUCOPHAGE ) 1000 MG tablet Take 1 tablet (1,000 mg total) by mouth 2 (two) times daily with a meal. TAKE 1 TABLET BY MOUTH TWICE DAILY WITH A MEAL . 07/19/23 07/13/24 Yes Sae Handrich, Emil Schanz, MD  methocarbamol  (ROBAXIN ) 500 MG tablet Take 1 tablet (500 mg total) by mouth 2 (two) times daily. 06/11/22  Yes Norleen Lynwood ORN, MD  rosuvastatin  (CRESTOR ) 10 MG tablet Take 1 tablet by mouth once daily 05/26/24  Yes Rourke Mcquitty, [place], MD  tirzepatide  (MOUNJARO ) 7.5 MG/0.5ML Pen Inject 7.5 mg into the skin once a week. 07/19/23  Yes Purcell Emil Schanz, MD    Allergies  Allergen Reactions   Percocet [Oxycodone-Acetaminophen ] Itching    Patient Active Problem List   Diagnosis Date Noted   Dyslipidemia 07/27/2018   Dyslipidemia associated with type 2 diabetes  mellitus (HCC) 12/10/2017    Past Medical History:  Diagnosis Date   Medical history non-contributory    PONV (postoperative nausea and vomiting)     Past Surgical History:  Procedure Laterality Date   ABDOMINAL HYSTERECTOMY N/A 03/31/2013   Procedure: HYSTERECTOMY ABDOMINAL;  Surgeon: Olam Mill, MD;  Location: [facility];  Service: Gynecology;  Laterality: N/A;  fibroids   BILATERAL SALPINGECTOMY Bilateral 03/31/2013   Procedure: BILATERAL SALPINGECTOMY;  Surgeon: Olam Mill, MD;  Location: [facility];  Service: Gynecology;  Laterality: Bilateral;   CESAREAN SECTION      Social History   Socioeconomic History   Marital status: Married    Spouse name: Not on file   Number of children: Not on file   Years of education: Not on file   Highest education level: Not on file  Occupational History   Not on file  Tobacco Use   Smoking status: Never   Smokeless tobacco: Never  Substance and Sexual Activity   Alcohol use: No   Drug use: No   Sexual activity: Not on file  Other Topics Concern   Not on file  Social History Narrative   Not on file   Social Drivers of Health   Financial Resource Strain: Not on file  Food Insecurity: Not on file  Transportation Needs: Not on file  Physical Activity: Not on file  Stress: Not on file  Social Connections: Not on file  Intimate Partner  Violence: Not on file    Family History  Problem Relation Age of Onset   Hyperlipidemia Mother    Hypertension Mother    Stroke Father      Review of Systems  Constitutional: Negative.  Negative for chills and fever.  HENT: Negative.  Negative for congestion and sore throat.   Respiratory: Negative.  Negative for cough and shortness of breath.   Cardiovascular: Negative.  Negative for chest pain and palpitations.  Gastrointestinal:  Negative for abdominal pain, diarrhea, nausea and vomiting.  Genitourinary: Negative.  Negative for dysuria and hematuria.  Skin: Negative.  Negative  for rash.  Neurological: Negative.  Negative for dizziness and headaches.  All other systems reviewed and are negative.   Today's Vitals   05/31/24 1338  BP: 114/82  Pulse: 91  Temp: 98.5 F (36.9 C)  TempSrc: Oral  SpO2: 97%  Weight: 181 lb (82.1 kg)  Height: 5' 2 (1.575 m)   Body mass index is 33.11 kg/m.   Physical Exam Vitals reviewed.  Constitutional:      Appearance: Normal appearance.  HENT:     Head: Normocephalic.     Right Ear: Tympanic membrane, ear canal and external ear normal.     Left Ear: Tympanic membrane, ear canal and external ear normal.     Mouth/Throat:     Mouth: Mucous membranes are moist.     Pharynx: Oropharynx is clear.  Eyes:     Extraocular Movements: Extraocular movements intact.     Conjunctiva/sclera: Conjunctivae normal.     Pupils: Pupils are equal, round, and reactive to light.  Cardiovascular:     Rate and Rhythm: Normal rate and regular rhythm.     Pulses: Normal pulses.     Heart sounds: Normal heart sounds.  Pulmonary:     Effort: Pulmonary effort is normal.     Breath sounds: Normal breath sounds.  Abdominal:     Palpations: Abdomen is soft.     Tenderness: There is no abdominal tenderness.  Musculoskeletal:     Cervical back: No tenderness.  Lymphadenopathy:     Cervical: No cervical adenopathy.  Skin:    General: Skin is warm and dry.     Capillary Refill: Capillary refill takes less than 2 seconds.  Neurological:     General: No focal deficit present.     Mental Status: She is alert and oriented to person, place, and time.  Psychiatric:        Mood and Affect: Mood normal.        Behavior: Behavior normal.    Results for orders placed or performed in visit on 05/31/24 (from the past 24 hours)  POCT HgB A1C     Status: Abnormal   Collection Time: 05/31/24  1:54 PM  Result Value Ref Range   Hemoglobin A1C 5.9 (A) 4.0 - 5.6 %   HbA1c POC (<> result, manual entry)     HbA1c, POC (prediabetic range)     HbA1c,  POC (controlled diabetic range)       ASSESSMENT & PLAN: Problem List Items Addressed This Visit       Endocrine   Dyslipidemia associated with type 2 diabetes mellitus (HCC)   Well-controlled diabetes with A1c of 5.9 Continues Mounjaro  7.5 mg weekly along with metformin  1000 mg twice a day Also taking rosuvastatin  10 mg daily Diet and nutrition discussed Cardiovascular risks associated with diabetes and dyslipidemia discussed      Relevant Orders   POCT HgB A1C (  Completed)   Microalbumin / creatinine urine ratio   Comprehensive metabolic panel with GFR   Lipid panel   Other Visit Diagnoses       Encounter for general adult medical examination with abnormal findings    -  Primary   Relevant Orders   Microalbumin / creatinine urine ratio   CBC with Differential/Platelet   Comprehensive metabolic panel with GFR   Lipid panel     Need for vaccination       Relevant Orders   Flu vaccine trivalent PF, 6mos and older(Flulaval,Afluria,Fluarix,Fluzone)     Screening for deficiency anemia       Relevant Orders   CBC with Differential/Platelet     Screening for endocrine, metabolic and immunity disorder       Relevant Orders   Comprehensive metabolic panel with GFR   Lipid panel      Modifiable risk factors discussed with patient. Anticipatory guidance according to age provided. The following topics were also discussed: Social Determinants of Health Smoking.  Non-smoker Diet and nutrition Benefits of exercise Cancer screening and need for breast cancer screening with mammogram Vaccinations review and recommendations Cardiovascular risk assessment and need for blood work The 10-year ASCVD risk score (Arnett DK, et al., 2019) is: 3.9%   Values used to calculate the score:     Age: 4 years     Clincally relevant sex: Female     Is Non-Hispanic African American: Yes     Diabetic: Yes     Tobacco smoker: No     Systolic Blood Pressure: 114 mmHg     Is BP treated: No      HDL Cholesterol: 54.1 mg/dL     Total Cholesterol: 163 mg/dL  Mental health including depression and anxiety Fall and accident prevention  Patient Instructions  Health Maintenance, Female Adopting a healthy lifestyle and getting preventive care are important in promoting health and wellness. Ask your health care provider about: The right schedule for you to have regular tests and exams. Things you can do on your own to prevent diseases and keep yourself healthy. What should I know about diet, weight, and exercise? Eat a healthy diet  Eat a diet that includes plenty of vegetables, fruits, low-fat dairy products, and lean protein. Do not eat a lot of foods that are high in solid fats, added sugars, or sodium. Maintain a healthy weight Body mass index (BMI) is used to identify weight problems. It estimates body fat based on height and weight. Your health care provider can help determine your BMI and help you achieve or maintain a healthy weight. Get regular exercise Get regular exercise. This is one of the most important things you can do for your health. Most adults should: Exercise for at least 150 minutes each week. The exercise should increase your heart rate and make you sweat (moderate-intensity exercise). Do strengthening exercises at least twice a week. This is in addition to the moderate-intensity exercise. Spend less time sitting. Even light physical activity can be beneficial. Watch cholesterol and blood lipids Have your blood tested for lipids and cholesterol at 54 years of age, then have this test every 5 years. Have your cholesterol levels checked more often if: Your lipid or cholesterol levels are high. You are older than 54 years of age. You are at high risk for heart disease. What should I know about cancer screening? Depending on your health history and family history, you may need to have cancer screening at various ages. This  may include screening for: Breast  cancer. Cervical cancer. Colorectal cancer. Skin cancer. Lung cancer. What should I know about heart disease, diabetes, and high blood pressure? Blood pressure and heart disease High blood pressure causes heart disease and increases the risk of stroke. This is more likely to develop in people who have high blood pressure readings or are overweight. Have your blood pressure checked: Every 3-5 years if you are 31-6 years of age. Every year if you are 48 years old or older. Diabetes Have regular diabetes screenings. This checks your fasting blood sugar level. Have the screening done: Once every three years after age 78 if you are at a normal weight and have a low risk for diabetes. More often and at a younger age if you are overweight or have a high risk for diabetes. What should I know about preventing infection? Hepatitis B If you have a higher risk for hepatitis B, you should be screened for this virus. Talk with your health care provider to find out if you are at risk for hepatitis B infection. Hepatitis C Testing is recommended for: Everyone born from [birth year] through 1966. Anyone with known risk factors for hepatitis C. Sexually transmitted infections (STIs) Get screened for STIs, including gonorrhea and chlamydia, if: You are sexually active and are younger than 54 years of age. You are older than 54 years of age and your health care provider tells you that you are at risk for this type of infection. Your sexual activity has changed since you were last screened, and you are at increased risk for chlamydia or gonorrhea. Ask your health care provider if you are at risk. Ask your health care provider about whether you are at high risk for HIV. Your health care provider may recommend a prescription medicine to help prevent HIV infection. If you choose to take medicine to prevent HIV, you should first get tested for HIV. You should then be tested every 3 months for as long as you are taking  the medicine. Pregnancy If you are about to stop having your period (premenopausal) and you may become pregnant, seek counseling before you get pregnant. Take 400 to 800 micrograms (mcg) of folic acid every day if you become pregnant. Ask for birth control (contraception) if you want to prevent pregnancy. Osteoporosis and menopause Osteoporosis is a disease in which the bones lose minerals and strength with aging. This can result in bone fractures. If you are 37 years old or older, or if you are at risk for osteoporosis and fractures, ask your health care provider if you should: Be screened for bone loss. Take a calcium  or vitamin D supplement to lower your risk of fractures. Be given hormone replacement therapy (HRT) to treat symptoms of menopause. Follow these instructions at home: Alcohol use Do not drink alcohol if: Your health care provider tells you not to drink. You are pregnant, may be pregnant, or are planning to become pregnant. If you drink alcohol: Limit how much you have to: 0-1 drink a day. Know how much alcohol is in your drink. In the [place] one drink equals one 12 oz bottle of beer (355 mL), one 5 oz glass of wine (148 mL), or one 1 oz glass of hard liquor (44 mL). Lifestyle Do not use any products that contain nicotine or tobacco. These products include cigarettes, chewing tobacco, and vaping devices, such as e-cigarettes. If you need help quitting, ask your health care provider. Do not use street drugs. Do not share  needles. Ask your health care provider for help if you need support or information about quitting drugs. General instructions Schedule regular health, dental, and eye exams. Stay current with your vaccines. Tell your health care provider if: You often feel depressed. You have ever been abused or do not feel safe at home. Summary Adopting a healthy lifestyle and getting preventive care are important in promoting health and wellness. Follow your health  care provider's instructions about healthy diet, exercising, and getting tested or screened for diseases. Follow your health care provider's instructions on monitoring your cholesterol and blood pressure. This information is not intended to replace advice given to you by your health care provider. Make sure you discuss any questions you have with your health care provider. Document Revised: 01/13/2021 Document Reviewed: 01/13/2021 Elsevier Patient Education  2024 Elsevier Inc.      Emil Schaumann, MD [facility]

## 2024-06-01 ENCOUNTER — Ambulatory Visit: Payer: Self-pay | Admitting: Emergency Medicine

## 2024-06-01 LAB — MICROALBUMIN / CREATININE URINE RATIO
Creatinine,U: 180.8 mg/dL
Microalb Creat Ratio: 6.2 mg/g (ref 0.0–30.0)
Microalb, Ur: 1.1 mg/dL (ref 0.0–1.9)

## 2024-06-26 ENCOUNTER — Other Ambulatory Visit: Payer: Self-pay | Admitting: Emergency Medicine

## 2024-06-26 DIAGNOSIS — E1169 Type 2 diabetes mellitus with other specified complication: Secondary | ICD-10-CM

## 2024-06-30 ENCOUNTER — Other Ambulatory Visit (HOSPITAL_COMMUNITY): Payer: Self-pay

## 2024-07-03 ENCOUNTER — Other Ambulatory Visit (HOSPITAL_COMMUNITY): Payer: Self-pay

## 2024-08-24 ENCOUNTER — Other Ambulatory Visit: Payer: Self-pay | Admitting: Emergency Medicine

## 2024-08-24 DIAGNOSIS — E1169 Type 2 diabetes mellitus with other specified complication: Secondary | ICD-10-CM

## 2024-08-28 ENCOUNTER — Other Ambulatory Visit (HOSPITAL_COMMUNITY): Payer: Self-pay

## 2024-08-28 ENCOUNTER — Telehealth: Payer: Self-pay

## 2024-08-28 NOTE — Telephone Encounter (Signed)
 Pharmacy Patient Advocate Encounter   Received notification from Onbase that prior authorization for MOUNJARO  7.5 MG/0.5ML Pen INJECT 7.5 MG is due for renewal.   Insurance verification completed.   The patient is insured through [employer].  Action: PA required; PA submitted to above mentioned insurance via Latent Key/confirmation #/EOC [id number] Status is pending

## 2024-08-29 ENCOUNTER — Other Ambulatory Visit (HOSPITAL_COMMUNITY): Payer: Self-pay

## 2024-08-29 NOTE — Telephone Encounter (Signed)
 Pharmacy Patient Advocate Encounter  Received notification from [employer] that Prior Authorization for Mounjaro  7.5MG /0.5ML auto-injectors  has been APPROVED from 08/29/2024 to 08/29/2025   PA #/Case ID/Reference #: [id number]

## 2024-09-17 ENCOUNTER — Other Ambulatory Visit: Payer: Self-pay | Admitting: Emergency Medicine

## 2024-09-17 DIAGNOSIS — E1169 Type 2 diabetes mellitus with other specified complication: Secondary | ICD-10-CM

## 2024-09-21 ENCOUNTER — Other Ambulatory Visit: Payer: Self-pay | Admitting: Emergency Medicine
